# Patient Record
Sex: Male | Born: 1945 | Race: White | Hispanic: No | Smoking: Former smoker
Health system: Southern US, Community
[De-identification: ages and names within clinical notes are randomized; demographics above are authoritative.]

## PROBLEM LIST (undated history)

## (undated) DIAGNOSIS — R0609 Other forms of dyspnea: Secondary | ICD-10-CM

## (undated) DIAGNOSIS — I2510 Atherosclerotic heart disease of native coronary artery without angina pectoris: Secondary | ICD-10-CM

## (undated) DIAGNOSIS — C61 Malignant neoplasm of prostate: Secondary | ICD-10-CM

## (undated) DIAGNOSIS — Z8546 Personal history of malignant neoplasm of prostate: Secondary | ICD-10-CM

## (undated) DIAGNOSIS — I219 Acute myocardial infarction, unspecified: Secondary | ICD-10-CM

## (undated) DIAGNOSIS — Z7901 Long term (current) use of anticoagulants: Secondary | ICD-10-CM

## (undated) DIAGNOSIS — J439 Emphysema, unspecified: Secondary | ICD-10-CM

## (undated) DIAGNOSIS — I252 Old myocardial infarction: Secondary | ICD-10-CM

## (undated) DIAGNOSIS — E785 Hyperlipidemia, unspecified: Secondary | ICD-10-CM

## (undated) DIAGNOSIS — R310 Gross hematuria: Secondary | ICD-10-CM

## (undated) DIAGNOSIS — Z87442 Personal history of urinary calculi: Secondary | ICD-10-CM

## (undated) DIAGNOSIS — F1010 Alcohol abuse, uncomplicated: Secondary | ICD-10-CM

## (undated) DIAGNOSIS — R351 Nocturia: Secondary | ICD-10-CM

## (undated) DIAGNOSIS — Z955 Presence of coronary angioplasty implant and graft: Secondary | ICD-10-CM

## (undated) DIAGNOSIS — R0600 Dyspnea, unspecified: Secondary | ICD-10-CM

## (undated) DIAGNOSIS — N35919 Unspecified urethral stricture, male, unspecified site: Secondary | ICD-10-CM

## (undated) DIAGNOSIS — I10 Essential (primary) hypertension: Secondary | ICD-10-CM

## (undated) HISTORY — DX: Essential (primary) hypertension: I10

## (undated) HISTORY — DX: Personal history of malignant neoplasm of prostate: Z85.46

## (undated) HISTORY — PX: CORONARY ANGIOPLASTY WITH STENT PLACEMENT: SHX49

## (undated) HISTORY — PX: PROSTATE BIOPSY: SHX241

## (undated) HISTORY — DX: Atherosclerotic heart disease of native coronary artery without angina pectoris: I25.10

## (undated) HISTORY — PX: COLONOSCOPY WITH PROPOFOL: SHX5780

---
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
## 1978-11-08 HISTORY — PX: OTHER SURGICAL HISTORY: SHX169

## 2009-02-18 ENCOUNTER — Ambulatory Visit: Admission: RE | Admit: 2009-02-18 | Discharge: 2009-05-19 | Payer: Self-pay | Admitting: Radiation Oncology

## 2009-03-18 ENCOUNTER — Encounter: Admission: RE | Admit: 2009-03-18 | Discharge: 2009-03-18 | Payer: Self-pay | Admitting: Urology

## 2009-03-31 ENCOUNTER — Ambulatory Visit (HOSPITAL_BASED_OUTPATIENT_CLINIC_OR_DEPARTMENT_OTHER): Admission: RE | Admit: 2009-03-31 | Discharge: 2009-03-31 | Payer: Self-pay | Admitting: Urology

## 2009-03-31 HISTORY — PX: OTHER SURGICAL HISTORY: SHX169

## 2009-05-20 ENCOUNTER — Ambulatory Visit: Admission: RE | Admit: 2009-05-20 | Discharge: 2009-06-11 | Payer: Self-pay | Admitting: Radiation Oncology

## 2010-05-08 DIAGNOSIS — I252 Old myocardial infarction: Secondary | ICD-10-CM

## 2010-05-08 DIAGNOSIS — Z955 Presence of coronary angioplasty implant and graft: Secondary | ICD-10-CM

## 2010-05-08 HISTORY — DX: Old myocardial infarction: I25.2

## 2010-05-08 HISTORY — DX: Presence of coronary angioplasty implant and graft: Z95.5

## 2010-05-14 ENCOUNTER — Encounter: Payer: Self-pay | Admitting: Emergency Medicine

## 2010-05-14 ENCOUNTER — Observation Stay (HOSPITAL_COMMUNITY): Admission: EM | Admit: 2010-05-14 | Discharge: 2010-05-16 | Payer: Self-pay | Admitting: Cardiovascular Disease

## 2010-05-14 ENCOUNTER — Ambulatory Visit: Payer: Self-pay | Admitting: Internal Medicine

## 2010-05-15 ENCOUNTER — Encounter: Payer: Self-pay | Admitting: Internal Medicine

## 2010-05-29 DIAGNOSIS — F17200 Nicotine dependence, unspecified, uncomplicated: Secondary | ICD-10-CM

## 2010-05-29 DIAGNOSIS — I10 Essential (primary) hypertension: Secondary | ICD-10-CM | POA: Insufficient documentation

## 2010-05-29 DIAGNOSIS — F1010 Alcohol abuse, uncomplicated: Secondary | ICD-10-CM

## 2010-05-29 DIAGNOSIS — C61 Malignant neoplasm of prostate: Secondary | ICD-10-CM

## 2010-06-09 ENCOUNTER — Ambulatory Visit: Payer: Self-pay | Admitting: Cardiology

## 2010-06-09 ENCOUNTER — Encounter: Payer: Self-pay | Admitting: Physician Assistant

## 2010-06-09 DIAGNOSIS — I2510 Atherosclerotic heart disease of native coronary artery without angina pectoris: Secondary | ICD-10-CM | POA: Insufficient documentation

## 2010-06-26 ENCOUNTER — Telehealth: Payer: Self-pay | Admitting: Internal Medicine

## 2010-07-01 ENCOUNTER — Encounter: Payer: Self-pay | Admitting: Internal Medicine

## 2010-07-21 ENCOUNTER — Telehealth (INDEPENDENT_AMBULATORY_CARE_PROVIDER_SITE_OTHER): Payer: Self-pay | Admitting: *Deleted

## 2010-07-21 ENCOUNTER — Encounter: Payer: Self-pay | Admitting: Cardiology

## 2010-08-13 ENCOUNTER — Ambulatory Visit: Payer: Self-pay | Admitting: Cardiology

## 2010-08-13 DIAGNOSIS — E785 Hyperlipidemia, unspecified: Secondary | ICD-10-CM | POA: Insufficient documentation

## 2010-09-07 ENCOUNTER — Telehealth: Payer: Self-pay | Admitting: Cardiology

## 2010-09-11 ENCOUNTER — Telehealth: Payer: Self-pay | Admitting: Cardiology

## 2010-11-06 ENCOUNTER — Telehealth (INDEPENDENT_AMBULATORY_CARE_PROVIDER_SITE_OTHER): Payer: Self-pay | Admitting: *Deleted

## 2010-11-11 ENCOUNTER — Ambulatory Visit
Admission: RE | Admit: 2010-11-11 | Discharge: 2010-11-11 | Payer: Self-pay | Source: Home / Self Care | Attending: Internal Medicine | Admitting: Internal Medicine

## 2010-11-11 ENCOUNTER — Other Ambulatory Visit: Payer: Self-pay | Admitting: Internal Medicine

## 2010-11-12 LAB — CBC WITH DIFFERENTIAL/PLATELET
Basophils Absolute: 0 10*3/uL (ref 0.0–0.1)
Basophils Relative: 0.5 % (ref 0.0–3.0)
Eosinophils Absolute: 0.2 10*3/uL (ref 0.0–0.7)
Eosinophils Relative: 2 % (ref 0.0–5.0)
HCT: 38.5 % — ABNORMAL LOW (ref 39.0–52.0)
Hemoglobin: 13.2 g/dL (ref 13.0–17.0)
Lymphocytes Relative: 21.5 % (ref 12.0–46.0)
Lymphs Abs: 1.7 10*3/uL (ref 0.7–4.0)
MCHC: 34.3 g/dL (ref 30.0–36.0)
MCV: 99.7 fl (ref 78.0–100.0)
Monocytes Absolute: 0.8 10*3/uL (ref 0.1–1.0)
Monocytes Relative: 10.1 % (ref 3.0–12.0)
Neutro Abs: 5.3 10*3/uL (ref 1.4–7.7)
Neutrophils Relative %: 65.9 % (ref 43.0–77.0)
Platelets: 309 10*3/uL (ref 150.0–400.0)
RBC: 3.86 Mil/uL — ABNORMAL LOW (ref 4.22–5.81)
RDW: 13.4 % (ref 11.5–14.6)
WBC: 8 10*3/uL (ref 4.5–10.5)

## 2010-11-13 ENCOUNTER — Ambulatory Visit
Admission: RE | Admit: 2010-11-13 | Discharge: 2010-11-13 | Payer: Self-pay | Source: Home / Self Care | Attending: Internal Medicine | Admitting: Internal Medicine

## 2010-11-13 ENCOUNTER — Encounter: Payer: Self-pay | Admitting: Internal Medicine

## 2010-11-13 DIAGNOSIS — R21 Rash and other nonspecific skin eruption: Secondary | ICD-10-CM | POA: Insufficient documentation

## 2010-12-08 NOTE — Progress Notes (Signed)
Summary: pt returning call  Phone Note Call from Patient Call back at [facility] Phone [phone]   Caller: Patient Summary of Call: Pt returning call Initial call taken by: Judie Grieve,  September 11, 2010 1:12 PM  Follow-up for Phone Call        I spoke with the pt. He is agreeable with stopping effient and starting plavix. He will hold plavix tomorrow (as well as effient) and start plavix on sunday. He verbalizes understanding. He will call back should he still notice increased bleeding from the nose and gums.  Follow-up by: Heather McGhee, RN, BSN,  September 11, 2010 2:12 PM    New/Updated Medications: PLAVIX 75 MG TABS (CLOPIDOGREL BISULFATE) Take one tablet by mouth daily Prescriptions: PLAVIX 75 MG TABS (CLOPIDOGREL BISULFATE) Take one tablet by mouth daily  #30 x 11   Entered by:   Heather McGhee, RN, BSN   Authorized by:   Haylynn Pha Rogers Makylah Bossard, MD, FACC   Signed by:   Heather McGhee, RN, BSN on 09/11/2010   Method used:   [facility] to        [facility]  [address] [address] [id number]       [address]       [place], [place]  [address]       Ph: [phone] or [phone]       Fax: [phone]   RxID:   [id number]

## 2010-12-08 NOTE — Letter (Signed)
Summary: [facility]  [facility]   Imported By: Marylou Mccoy 06/23/2010 16:46:32  _____________________________________________________________________  External Attachment:    Type:   Image     Comment:   External Document

## 2010-12-08 NOTE — Progress Notes (Signed)
Summary: sided effect from meds- nose bleed  ask DB  Phone Note Call from Patient Call back at Home Phone [phone]   Caller: Patient Reason for Call: Talk to Nurse Summary of Call: side effect from meds - nose bleed.  Initial call taken by: Lorne Skeens,  June 26, 2010 3:24 PM  Follow-up for Phone Call        noticed yesterday after sneezing - blood on shirt.  it has occurred today as well.  He is on Effient and was started on 325 mg ASA 06/09/2010 when he saw [place].  Will review with Dr Gala Romney Follow-up by: Charolotte Capuchin, RN,  June 26, 2010 3:33 PM  Additional Follow-up for Phone Call Additional follow up Details #1::        PER DR Shalona Harbour HAVE PT DECREASE ASA TO 81 MG AND TO RETURN CALL WITH UPDATE.PT VERBALZIED UNDERSTANDING. Additional Follow-up by: Scherrie Bateman, LPN,  June 26, 2010 3:57 PM

## 2010-12-08 NOTE — Progress Notes (Signed)
Summary: nose -gums bleeding  Phone Note Call from Patient   Caller: Patient  [phone] Reason for Call: Talk to Nurse Summary of Call: gums and nose were bleeding this weekend-pt been on effient since july-pls advise Initial call taken by: Glynda Jaeger,  September 07, 2010 10:20 AM  Follow-up for Phone Call        this past Friday he was working in the cold,  his nose started bleeding and kept up for several hours.  Is taking Effient 10mg  and ASA 81 mg.  Bleeding stopped after getting out of cold.  Today afterblowing nose, some blood there.  Occasionally gums bleed. has not seen any blood in stool or urine that he has noticed.  Pt also taking Flomax 0.4 mg tablet daily.  [facility] if there are to be any medication changes.  Pt aware Dr Juanda Chance is not in the office today and since he has no active bleeding now, we will review with him and call pt back with any changes necessary.  Pt aware to call back if the bleeding re-occurs of starts from a new area. Follow-up by: Charolotte Capuchin, RN,  September 07, 2010 12:51 PM     Appended Document: nose -gums bleeding Reviewed with Dr. Juanda Chance. Orders received to have the pt. d/c effient and start plavix. The pt should d/c effient, then take nothing the day after, then on following day he should start plavix. I have left a message for the pt. to call.

## 2010-12-08 NOTE — Letter (Signed)
[facility]   Imported By: Marylou Mccoy 07/23/2010 15:09:00  _____________________________________________________________________  External Attachment:    Type:   Image     Comment:   External Document

## 2010-12-08 NOTE — Progress Notes (Signed)
  Phone Note Other Incoming   Request: Send information Summary of Call: Request for records received from [facility]. Faxed 06/09/2010 ov and EKG to [phone].

## 2010-12-08 NOTE — Assessment & Plan Note (Signed)
Summary: eph/ gd   Visit Type:  post-hospital  CC:  No complains.  History of Present Illness: This is a 65 year old white male patient who was admitted to the hospital with a non-ST elevation MI treated with drug-eluting stent to the first obtuse marginal. He had normal LV function ejection fraction 65% and nonobstructive disease elsewhere. The patient does have a history of hypertension hyperlipidemia and tobacco abuse. He claims he quit smoking the day he will was admitted to the hospital.  Patient denies chest pain palpitations dyspnea dyspnea on exertion dizziness or presyncope. The patient has inadvertently forgotten to take aspirin.  Current Medications (verified): 1)  Lisinopril 10 Mg Tabs (Lisinopril) .... Take One Tablet By Mouth Daily 2)  Effient 10 Mg Tabs (Prasugrel Hcl) .... Take One Tablet By Mouth Daily 3)  Metoprolol Succinate 25 Mg Xr24h-Tab (Metoprolol Succinate) .... Take One Tablet By Mouth Daily 4)  Simvastatin 20 Mg Tabs (Simvastatin) .... Take One Tablet By Mouth Daily At Bedtime  Allergies (verified): 1)  ! Hydrocodone  Past History:  Past Medical History: Last updated: 05/29/2010  1. Hypertension.   2. Ongoing tobacco use, 2 packs per day.   3. Alcohol use.  Reports 2 to 3 beers a day.   4. History of prostate cancer status post XRT and seed implantation in       May 2010.   Review of Systems       see history of present illness  Vital Signs:  Patient profile:   65 year old male Height:      68 inches Weight:      185.50 pounds BMI:     28.31 Pulse rate:   61 / minute Pulse rhythm:   regular Resp:     18 per minute BP sitting:   100 / 58  (left arm) Cuff size:   large  Vitals Entered By: Vikki Ports (June 09, 2010 10:32 AM)  Physical Exam  General:   Well-nournished, in no acute distress.smells of cigarette smoke. Neck: No JVD, HJR, Bruit, or thyroid enlargement Lungs: No tachypnea, clear without wheezing, rales, or  rhonchi Cardiovascular: RRR, PMI not displaced, heart sounds normal, no murmurs, gallops, bruit, thrill, or heave. Abdomen: BS normal. Soft without organomegaly, masses, lesions or tenderness. Extremities: right radial artery without hematoma or hemorrhage she has good brachial pulse, lower extremities without cyanosis, clubbing or edema. Good distal pulses bilateral SKin: Warm, no lesions or rashes  Musculoskeletal: No deformities Neuro: no focal signs    EKG  Procedure date:  06/09/2010  Findings:      normal sinus rhythm normal EKG  Impression & Recommendations:  Problem # 1:  CAD, NATIVE VESSEL (ICD-414.01) Patient had non-ST elevation MI treated with stenting to the OM with a drug-eluting stent May 14, 2010. His normal LV function. He is doing well without further chest pain.resume coated aspirin 325 mg once a day His updated medication list for this problem includes:    Lisinopril 10 Mg Tabs (Lisinopril) .Marland Kitchen... Take one tablet by mouth daily    Effient 10 Mg Tabs (Prasugrel hcl) .Marland Kitchen... Take one tablet by mouth daily    Metoprolol Succinate 25 Mg Xr24h-tab (Metoprolol succinate) .Marland Kitchen... Take one tablet by mouth daily    Aspirin 325 Mg Tabs (Aspirin) .Marland Kitchen... Take one tablet by mouth daily  Problem # 2:  TOBACCO USER (ICD-305.1) Patient claims he quit smoking but he does smell of cigarette smoke. He is in a house where others smoke. I did  counsel him on the importance of smoking cessation.  Problem # 3:  HYPERTENSION (ICD-401.9) blood pressure stable His updated medication list for this problem includes:    Lisinopril 10 Mg Tabs (Lisinopril) .Marland Kitchen... Take one tablet by mouth daily    Metoprolol Succinate 25 Mg Xr24h-tab (Metoprolol succinate) .Marland Kitchen... Take one tablet by mouth daily    Aspirin 325 Mg Tabs (Aspirin) .Marland Kitchen... Take one tablet by mouth daily  Patient Instructions: 1)  Your physician recommends that you schedule a follow-up appointment in: 2 months with Dr. Gala Romney 2)  Your  physician has recommended you make the following change in your medication: Add Asprin 325 mg take one tablet by mouth once daily

## 2010-12-08 NOTE — Miscellaneous (Signed)
  Clinical Lists Changes  Medications: Changed medication from ASPIRIN 325 MG TABS (ASPIRIN) take one tablet by mouth daily to ASPIRIN 81 MG TBEC (ASPIRIN) Take one tablet by mouth daily Changed medication from SIMVASTATIN 20 MG TABS (SIMVASTATIN) Take one tablet by mouth daily at bedtime to SIMVASTATIN 40 MG TABS (SIMVASTATIN) 1 tab once daily Observations: Added new observation of MEDRECON: current updated (08/13/2010 9:09) Added new observation of NKA: F (08/13/2010 9:09) Added new observation of ALLERGY REV: Done (08/13/2010 9:09)      Current Medications (verified): 1)  Lisinopril 10 Mg Tabs (Lisinopril) .... Take One Tablet By Mouth Daily 2)  Effient 10 Mg Tabs (Prasugrel Hcl) .... Take One Tablet By Mouth Daily 3)  Metoprolol Succinate 25 Mg Xr24h-Tab (Metoprolol Succinate) .... Take One Tablet By Mouth Daily 4)  Simvastatin 40 Mg Tabs (Simvastatin) .Marland Kitchen.. 1 Tab Once Daily 5)  Aspirin 81 Mg Tbec (Aspirin) .... Take One Tablet By Mouth Daily  Allergies (verified): 1)  ! Hydrocodone

## 2010-12-08 NOTE — Assessment & Plan Note (Signed)
Summary: per check out   Visit Type:  Follow-up Primary Provider:  Dr [facility].  CC:  pt stop smoking- no cardiac complaints.  History of Present Illness: [name] is 65 years old and return for management of CAD. In July of 2011 he had a non-ST elevation MI and was treated with a DES to the marginal branch the circumflex artery. His ejection fraction is 65%. He has done well since that time has had no recent chest pain shortness of breath or palpitations. He was on 325 mg of aspirin and this was decreased at 81 mg when he developed nose bleeds.  He was able to stop smoking after his heart attack. He has gained some weight since that time.  His other problems include hypertension and hyperlipidemia. He also has cancer of the prostate which was treated with seed therapy in 2010.  Current Medications (verified): 1)  Lisinopril 10 Mg Tabs (Lisinopril) .... Take One Tablet By Mouth Daily 2)  Effient 10 Mg Tabs (Prasugrel Hcl) .... Take One Tablet By Mouth Daily 3)  Metoprolol Succinate 25 Mg Xr24h-Tab (Metoprolol Succinate) .... Take One Tablet By Mouth Daily 4)  Simvastatin 40 Mg Tabs (Simvastatin) .Marland Kitchen.. 1 Tab Once Daily 5)  Aspirin 81 Mg Tbec (Aspirin) .... Take One Tablet By Mouth Daily  Allergies (verified): 1)  ! Hydrocodone  Past History:  Past Medical History: Last updated: 05/29/2010  1. Hypertension.   2. Ongoing tobacco use, 2 packs per day.   3. Alcohol use.  Reports 2 to 3 beers a day.   4. History of prostate cancer status post XRT and seed implantation in       May 2010.   Review of Systems       ROS is negative except as outlined in HPI.   Vital Signs:  Patient profile:   65 year old male Height:      68 inches Weight:      202 pounds BMI:     30.83 Pulse rate:   53 / minute BP sitting:   127 / 77  (left arm) Cuff size:   large  Vitals Entered By: Burnett Kanaris, CNA (August 13, 2010 9:10 AM)  Physical Exam  Additional Exam:  Gen.  Well-nourished, in no distress   Neck: No JVD, thyroid not enlarged, no carotid bruits Lungs: No tachypnea, clear without rales, rhonchi or wheezes Cardiovascular: Rhythm regular, PMI not displaced,  heart sounds  normal, no murmurs or gallops, no peripheral edema, pulses normal in all 4 extremities. Abdomen: BS normal, abdomen soft and non-tender without masses or organomegaly, no hepatosplenomegaly. MS: No deformities, no cyanosis or clubbing   Neuro:  No focal sns   Skin:  no lesions    Impression & Recommendations:  Problem # 1:  CAD, NATIVE VESSEL (ICD-414.01) He had a non-ST elevation MI and was treated with a DES to the circumflex artery. He's had no chest pain this prompted stable. His updated medication list for this problem includes:    Lisinopril 10 Mg Tabs (Lisinopril) .Marland Kitchen... Take one tablet by mouth daily    Effient 10 Mg Tabs (Prasugrel hcl) .Marland Kitchen... Take one tablet by mouth daily    Metoprolol Succinate 25 Mg Xr24h-tab (Metoprolol succinate) .Marland Kitchen... Take one tablet by mouth daily    Aspirin 81 Mg Tbec (Aspirin) .Marland Kitchen... Take one tablet by mouth daily  His updated medication list for this problem includes:    Lisinopril 10 Mg Tabs (Lisinopril) .Marland Kitchen... Take one  tablet by mouth daily    Effient 10 Mg Tabs (Prasugrel hcl) .Marland Kitchen... Take one tablet by mouth daily    Metoprolol Succinate 25 Mg Xr24h-tab (Metoprolol succinate) .Marland Kitchen... Take one tablet by mouth daily    Aspirin 81 Mg Tbec (Aspirin) .Marland Kitchen... Take one tablet by mouth daily  Orders: EKG w/ Interpretation (93000)  Problem # 2:  HYPERTENSION (ICD-401.9) His blood pressure is well controlled on current medications. His updated medication list for this problem includes:    Lisinopril 10 Mg Tabs (Lisinopril) .Marland Kitchen... Take one tablet by mouth daily    Metoprolol Succinate 25 Mg Xr24h-tab (Metoprolol succinate) .Marland Kitchen... Take one tablet by mouth daily    Aspirin 81 Mg Tbec (Aspirin) .Marland Kitchen... Take one tablet by mouth daily  Problem # 3:  TOBACCO  USER (ICD-305.1) He has been able to stop smoking since his heart attack.  Problem # 4:  HYPERLIPIDEMIA-MIXED (ICD-272.4) This is being managed by his primary care physician. His simvastatin was recently increased. His updated medication list for this problem includes:    Simvastatin 40 Mg Tabs (Simvastatin) .Marland Kitchen... 1 tab once daily  Patient Instructions: 1)  Your physician wants you to follow-up in:  July 2012 with Dr. Gala Romney. You will receive a reminder letter in the mail two months in advance. If you don't receive a letter, please call our office to schedule the follow-up appointment. 2)  Your physician recommends that you continue on your current medications as directed. Please refer to the Current Medication list given to you today.

## 2010-12-10 NOTE — Assessment & Plan Note (Signed)
Summary: rov. gd   Visit Type:  Follow-up Primary Provider:  Dr Larina [facility]  CC:  possible med allergy (plavix).  History of Present Illness: Aaron King is 65 year-old male with h/o HTN, HL, tobacco use and CAD.  In July of 2011 he had a non-ST elevation MI and was treated with a DES to the marginal branch the circumflex artery. His ejection fraction is 65%. Initially on Effient but switched to Plavix due to gum bleeding and epistaxis. Presents today for unscheduled f/u due to bruising and rash.  Called the office recently about pruritic rash on arms and legs. Thought it might be due to Plavix. Started on Benadryl and scheduled to f/u with me today.   Benadryl has helped with itching but has persistent rash. Occasional bruising.  No CP or SOB. BP well controlled.   He was able to stop smoking after his heart attack and has not started back.  Current Medications (verified): 1)  Lisinopril 10 Mg Tabs (Lisinopril) .... Take One Tablet By Mouth Daily 2)  Plavix 75 Mg Tabs (Clopidogrel Bisulfate) .... Take One Tablet By Mouth Daily 3)  Metoprolol Succinate 25 Mg Xr24h-Tab (Metoprolol Succinate) .... Take One Tablet By Mouth Daily 4)  Simvastatin 40 Mg Tabs (Simvastatin) .Marland Kitchen.. 1 Tab Once Daily 5)  Aspirin 81 Mg Tbec (Aspirin) .... Take One Tablet By Mouth Daily 6)  Nitrostat 0.4 Mg Subl (Nitroglycerin) .Marland Kitchen.. 1 Tablet Under Tongue At Onset of Chest Pain; You May Repeat Every 5 Minutes For Up To 3 Doses. 7)  Benadryl 25 Mg Tabs (Diphenhydramine Hcl) 8)  Tamsulosin Hcl 0.4 Mg Caps (Tamsulosin Hcl) .... Take 1 Tablet By Mouth Once A Day 9)  Vitamin B-12 500 Mcg  Tabs (Cyanocobalamin) .... Take 1 Tablet By Mouth Once A Day  Allergies (verified): 1)  ! Hydrocodone  Past History:  Past Medical History:  1. Hypertension.   2. CAD s/p MI July 2011 with DES       --Effient stopped due to gum bleeding and epistaxis  3. Ongoing tobacco use, 2 packs per day.   4. History of alcohol use.   Reports 2 to 3 beers a day.   5. History of prostate cancer status post XRT and seed implantation in       May 2010.   Review of Systems       As per HPI and past medical history; otherwise all systems negative.   Vital Signs:  Patient profile:   64 year old male Height:      68 inches Weight:      200 pounds BMI:     30.52 Pulse rate:   57 / minute BP sitting:   128 / 84  (left arm) Cuff size:   regular  Vitals Entered By: Hardin Negus, RMA (November 13, 2010 9:29 AM)  Physical Exam  General:  Gen: well appearing. no resp difficulty HEENT: normal except for actinic keratoses. Neck: supple. no JVD. Carotids 2+ bilat; no bruits. No lymphadenopathy or thryomegaly appreciated. Cor: PMI nondisplaced. Regular rate & rhythm. No rubs, gallops, murmur. Lungs: clear Abdomen: soft, nontender, nondistended. No hepatosplenomegaly. No bruits or masses. Good bowel sounds. Extremities: no cyanosis, clubbing, rash, edema Neuro: alert & orientedx3, cranial nerves grossly intact. moves all 4 extremities w/o difficulty. affect pleasant Skin: multiple scaly patches on arms and legs mild associated brusing   Impression & Recommendations:  Problem # 1:  RASH AND OTHER NONSPECIFIC SKIN ERUPTION (ICD-782.1) Appears to be eczema  not a drug rash. Will try 1% hydrocortisone cream and refer to dermatology to follow-up. Continue benadryl prn for symptomatic relief.  Problem # 2:  CAD, NATIVE VESSEL (ICD-414.01) Stable. No evidence of ischemia. Continue current regimen. Needs to stay on Plavix for at least another 6 months (1 year out from DES).   Other Orders: Misc. Referral (Misc. Ref)  Patient Instructions: 1)  We have sent in a prescription for Hydrocortizone Cream 2)  You have been referred to [facility] 3)  Your physician wants you to follow-up in:  6 months.  You will receive a reminder letter in the mail two months in advance. If you don't receive a letter, please call our  office to schedule the follow-up appointment. Prescriptions: HYDROCORTISONE 1 % CREA (HYDROCORTISONE) apply 2-3 times daily as needed  #2 tubes x 3   Entered by:   Meredith Staggers, RN   Authorized by:   Dolores Patty, MD, [facility]   Signed by:   Meredith Staggers, RN on 11/13/2010   Method used:   [facility]  [address] [phone]       [address]       [place], [place]  [address]       Ph: [phone] or [phone]       Fax: [phone]   RxID:   [id number]

## 2010-12-10 NOTE — Progress Notes (Signed)
Summary: Rash and itching  Phone Note Outgoing Call   Call placed by: rhonda Call placed to: Patient Details for Reason: pt stopped by to report reaction to plavix at 11:26 today Summary of Call: pt states that he has a rash on arms and legs and it itches a lot and that both his arms looks as if he has been in a fight a lost. Pt also c/o dizziness-last night when he bent over to tie his shoes and a few hours ago when he was leaning over. He does not know what MD he is assigned to since Dr. Juanda Chance retired. adv would get back to him.  Initial call taken by: Claris Gladden RN,  November 06, 2010 1:43 PM  Follow-up for Phone Call        11/05/10--1515pm--pt states having reaction to plavix--rash over body--some bruising--advised to d/c plavix and con't with asa 81mg  once daily, and i will find out who dr Juanda Chance wants him to f/u with and make him appoint with that doctor 11/05/10--1545PM--Called pt back and left mess. that his new doctor is dr bensimhon and i have made him an appoint with dr bensimhon on jan 6 at 0915--nt Follow-up by: Ledon Snare, RN,  November 06, 2010 3:19 PM  Additional Follow-up for Phone Call Additional follow up Details #1::        SPoke with pt who is aware that stopping the Plavix is not an option without him being on antiplatelet therapy due to his increased risk of clost formation in his stent placed in 05/2010.  Pt states understanding and that he will stay on Plavix and use OTC benadryl until his appt 11/13/2010 with Dr Gala Romney. Additional Follow-up by: Charolotte Capuchin, RN,  November 06, 2010 4:52 PM     Appended Document: Rash and itching is rash getting better? he also needs a CBC to check his counts. can we do this today or tomorrow prior to his appt?   Appended Document: Rash and itching 11/11/10--1045am--phoned pt to see how he was doing but no answer and no answering machine--nt  Appended Document: Rash and itching 11/11/10--1330pm--spoke to mr Chim  who states rash and itching are about the same--he continues with benadryl and plavix--he will come in today 1/4 for CBC --he will see dr bensimhon on 1/6

## 2011-01-24 LAB — CARDIAC PANEL(CRET KIN+CKTOT+MB+TROPI)
CK, MB: 45.1 ng/mL (ref 0.3–4.0)
CK, MB: 65.8 ng/mL (ref 0.3–4.0)
Total CK: 366 U/L — ABNORMAL HIGH (ref 7–232)
Total CK: 507 U/L — ABNORMAL HIGH (ref 7–232)

## 2011-01-24 LAB — URINALYSIS, ROUTINE W REFLEX MICROSCOPIC
Bilirubin Urine: NEGATIVE
Hgb urine dipstick: NEGATIVE
Nitrite: NEGATIVE
Protein, ur: NEGATIVE mg/dL
Urobilinogen, UA: 0.2 mg/dL (ref 0.0–1.0)

## 2011-01-24 LAB — COMPREHENSIVE METABOLIC PANEL
Albumin: 4.1 g/dL (ref 3.5–5.2)
Alkaline Phosphatase: 70 U/L (ref 39–117)
BUN: 7 mg/dL (ref 6–23)
CO2: 25 mEq/L (ref 19–32)
Chloride: 102 mEq/L (ref 96–112)
GFR calc non Af Amer: >60 mL/min (ref >60)
Glucose, Bld: 104 mg/dL — ABNORMAL HIGH (ref 70–99)
Potassium: 5.1 mEq/L (ref 3.5–5.1)
Total Bilirubin: 0.9 mg/dL (ref 0.3–1.2)

## 2011-01-24 LAB — DIFFERENTIAL
Basophils Absolute: 0.1 10*3/uL (ref 0.0–0.1)
Basophils Relative: 1 % (ref 0–1)
Monocytes Absolute: 0.9 10*3/uL (ref 0.1–1.0)
Neutro Abs: 6.1 10*3/uL (ref 1.7–7.7)

## 2011-01-24 LAB — CBC
HCT: 44.3 % (ref 39.0–52.0)
HCT: 39.3 % (ref 39.0–52.0)
Hemoglobin: 13.8 g/dL (ref 13.0–17.0)
MCH: 36 pg — ABNORMAL HIGH (ref 26.0–34.0)
MCH: 35.6 pg — ABNORMAL HIGH (ref 26.0–34.0)
MCH: 36.1 pg — ABNORMAL HIGH (ref 26.0–34.0)
MCHC: 34.9 g/dL (ref 30.0–36.0)
MCHC: 34.3 g/dL (ref 30.0–36.0)
MCHC: 35 g/dL (ref 30.0–36.0)
MCV: 103.4 fL — ABNORMAL HIGH (ref 78.0–100.0)
MCV: 103.9 fL — ABNORMAL HIGH (ref 78.0–100.0)
Platelets: 313 10*3/uL (ref 150–400)
Platelets: 243 10*3/uL (ref 150–400)
RDW: 13.3 % (ref 11.5–15.5)
RDW: 13.6 % (ref 11.5–15.5)

## 2011-01-24 LAB — BASIC METABOLIC PANEL
BUN: 8 mg/dL (ref 6–23)
CO2: 27 mEq/L (ref 19–32)
Chloride: 100 mEq/L (ref 96–112)
Creatinine, Ser: 0.93 mg/dL (ref 0.4–1.5)

## 2011-01-24 LAB — LIPID PANEL
Cholesterol: 166 mg/dL (ref 0–200)
LDL Cholesterol: 84 mg/dL (ref 0–99)
Total CHOL/HDL Ratio: 2.6 RATIO
Triglycerides: 92 mg/dL (ref <150)

## 2011-01-24 LAB — CK TOTAL AND CKMB (NOT AT ARMC): Relative Index: 9.7 — ABNORMAL HIGH (ref 0.0–2.5)

## 2011-01-24 LAB — PROTIME-INR: Prothrombin Time: 13.2 seconds (ref 11.6–15.2)

## 2011-01-24 LAB — TROPONIN I: Troponin I: 0.8 ng/mL (ref 0.00–0.06)

## 2011-02-15 ENCOUNTER — Telehealth: Payer: Self-pay | Admitting: Internal Medicine

## 2011-02-15 MED ORDER — CLOPIDOGREL BISULFATE 75 MG PO TABS: 75.0000 mg | ORAL_TABLET | Freq: Every day | ORAL | Status: DC

## 2011-02-16 LAB — CBC
HCT: 40.8 % (ref 39.0–52.0)
Platelets: 304 10*3/uL (ref 150–400)
RDW: 13.1 % (ref 11.5–15.5)

## 2011-02-16 LAB — COMPREHENSIVE METABOLIC PANEL
Albumin: 4 g/dL (ref 3.5–5.2)
Alkaline Phosphatase: 65 U/L (ref 39–117)
BUN: 6 mg/dL (ref 6–23)
Potassium: 5.6 mEq/L — ABNORMAL HIGH (ref 3.5–5.1)
Sodium: 141 mEq/L (ref 135–145)
Total Protein: 6.3 g/dL (ref 6.0–8.3)

## 2011-02-16 LAB — PROTIME-INR: INR: 1 (ref 0.00–1.49)

## 2011-03-23 NOTE — Op Note (Signed)
Aaron King, RAUDENBUSH NO.:  [id number]   MEDICAL RECORD NO.:  [id number]          PATIENT TYPE:  AMB   LOCATION:  [facility]                         FACILITY:  [facility]   PHYSICIAN:  Valetta Fuller, M.D.  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   POSTOPERATIVE DIAGNOSIS:  Favorable clinical stage T1 C adenocarcinoma  of the prostate.   PROCEDURE PERFORMED:  1. I 125 seed implantation of the prostate via Engineer, technical sales.  2. Flexible cystoscopy.   SURGEON:  Valetta Fuller, M.D.   ASSISTANT:  Maryln Gottron, M.D.   ANESTHESIA:  General.   INDICATIONS:  Mr. Mabe is 65 years of age.  He had a very strong  family history of prostate cancer.  PSA was actually normal at 3.2.  Ultrasound revealed a small prostate of 24 grams.  The patient had one  out of 12 biopsies positive for adenocarcinoma prostate.  This involved  20% of one core and was Gleason 3+3=6.  We discussed at length treatment  options and he elected to proceed with a monotherapy via brachytherapy  which we felt was a very reasonable decision.  The patient was seen in  consultation with Dr. Ballard Russell.  The patient also had prostate cancer  pathology sent to the prostate lab [facility]. His pathology was felt to be  favorable.  The patient appeared to understand the advantages and  disadvantages of seed implantation and full informed consent was  obtained.   TECHNIQUE AND FINDINGS:  The patient received perioperative  ciprofloxacin.  He had successful induction of general anesthesia and  was placed in the mid lithotomy position.  Appropriate surgical time-out  was performed.  The patient had transrectal ultrasound probe placed  which was anchored to a floor stand.  Anchoring needles were placed in  the  prostate.  Foley catheter with contrast in the balloon was then  inserted without difficulty.  Radiation oncology then spent time  contouring the urethra, prostate and rectum.  Dosing parameters were  then established and a real time plan was established.  The reference  dose was 145 Gy.  Each needle was passed with real time sagittal  ultrasound guidance.  Total of 30 active needles were placed.  Each seed  implantation was then done by the nuclear drawn robotic implanter.  A  total number of activated seeds was 80.  At the completion of the  procedure, the distribution fluoroscopically looked excellent.  Foley  catheter was removed and flexible cystoscopy revealed no  evidence of seeds within the prostatic urethra or bladder.  Lidocaine  jelly was instilled and a new Foley catheter was placed which drained  completely clear urine.  The patient appeared to tolerate the procedure  well.  There did not appear to be any obvious complications or problems  and he was brought to recovery room in stable condition.  Valetta Fuller, M.D.  Electronically Signed     DSG/MEDQ  D:  03/31/2009  T:  03/31/2009  Job:  [id number]   cc:   Maryln Gottron, M.D.  Fax: [phone]   Burnell Blanks, MD  Fax: [phone]

## 2011-06-03 ENCOUNTER — Encounter: Payer: Self-pay | Admitting: Internal Medicine

## 2011-06-07 ENCOUNTER — Ambulatory Visit (INDEPENDENT_AMBULATORY_CARE_PROVIDER_SITE_OTHER): Payer: BC Managed Care – PPO | Admitting: Internal Medicine

## 2011-06-07 ENCOUNTER — Encounter: Payer: Self-pay | Admitting: Internal Medicine

## 2011-06-07 ENCOUNTER — Ambulatory Visit: Payer: Self-pay | Admitting: Internal Medicine

## 2011-06-07 VITALS — BP 110/72 | HR 66 | Ht 68.0 in | Wt 209.0 lb

## 2011-06-07 DIAGNOSIS — I2510 Atherosclerotic heart disease of native coronary artery without angina pectoris: Secondary | ICD-10-CM

## 2011-06-07 DIAGNOSIS — I10 Essential (primary) hypertension: Secondary | ICD-10-CM

## 2011-06-07 DIAGNOSIS — E785 Hyperlipidemia, unspecified: Secondary | ICD-10-CM

## 2011-06-07 MED ORDER — ATORVASTATIN CALCIUM 40 MG PO TABS: 40.0000 mg | ORAL_TABLET | Freq: Every day | ORAL | Status: DC

## 2011-06-07 NOTE — Progress Notes (Signed)
HPI:  Aaron King is a very pleasant 65 year-old male with h/o HTN, HL, tobacco use and CAD.  In July of 2011 he had a non-ST elevation MI and was treated with a DES to the marginal branch the circumflex artery. His ejection fraction is 65%. Initially on Effient but switched to Plavix due to gum bleeding and epistaxis.  Doing quite well. No CP or SOB.  BP well controlled. Dr. Nathanial Rancher switched him from simvastatin to Vytorin 10-40 due to elevated LDL with simva 40. Having trouble affording Vytorin.   He was able to stop smoking after his heart attack and has not started back.   ROS: All systems negative except as listed in HPI, PMH and Problem List.  Past Medical History  Diagnosis Date  . Hypertension   . CAD (coronary artery disease)     s/p MI july 2011 with DES. Effient stopped due to gum bleeding and epistaxis  . Tobacco user     2 PPD  . Alcohol use     2-3 beers a day  . History of prostate cancer     s/p XRT and seed implantation in May 2010    Current Outpatient Prescriptions  Medication Sig Dispense Refill  . aspirin 81 MG EC tablet Take 81 mg by mouth daily.        . clopidogrel (PLAVIX) 75 MG tablet Take 1 tablet (75 mg total) by mouth daily.  90 tablet  3  . diphenhydrAMINE (BENADRYL) 25 MG tablet Take 25 mg by mouth every 6 (six) hours as needed.        . hydrocortisone 1 % cream Apply 1 application topically 3 (three) times daily as needed.        Marland Kitchen lisinopril (PRINIVIL,ZESTRIL) 10 MG tablet Take 10 mg by mouth daily.        . metoprolol succinate (TOPROL-XL) 25 MG 24 hr tablet Take 25 mg by mouth daily.        . nitroGLYCERIN (NITROSTAT) 0.4 MG SL tablet Place 0.4 mg under the tongue every 5 (five) minutes as needed.        . Tamsulosin HCl (FLOMAX) 0.4 MG CAPS Take 0.4 mg by mouth daily.        . vitamin B-12 (CYANOCOBALAMIN) 500 MCG tablet Take 500 mcg by mouth daily.           PHYSICAL EXAM: Filed Vitals:   06/07/11 1408  BP: 110/72  Pulse: 66   General:  Well  appearing. No resp difficulty HEENT: normal Neck: supple. JVP flat. Carotids 2+ bilaterally; no bruits. No lymphadenopathy or thryomegaly appreciated. Cor: PMI normal. Regular rate & rhythm. No rubs, gallops or murmurs. Lungs: clear Abdomen: soft, nontender, nondistended.Peri Jefferson bowel sounds. Extremities: no cyanosis, clubbing, rash, edema Neuro: alert & orientedx3, cranial nerves grossly intact. Moves all 4 extremities w/o difficulty. Affect pleasant.   ECG: NSR 66 mild ST scooping   ASSESSMENT & PLAN:

## 2011-06-07 NOTE — Assessment & Plan Note (Signed)
Having problems affording Vytorin. Will switch to atorvastatin 40 in attempt to get LDL < 70.

## 2011-06-07 NOTE — Assessment & Plan Note (Signed)
Blood pressure well controlled. Continue current regimen.  

## 2011-06-07 NOTE — Assessment & Plan Note (Signed)
No evidence of ischemia. Continue current regimen.   

## 2011-07-07 ENCOUNTER — Other Ambulatory Visit: Payer: Self-pay | Admitting: *Deleted

## 2011-07-07 MED ORDER — METOPROLOL SUCCINATE ER 25 MG PO TB24: 25.0000 mg | ORAL_TABLET | Freq: Every day | ORAL | Status: DC

## 2011-07-28 ENCOUNTER — Encounter: Payer: Self-pay | Admitting: Internal Medicine

## 2012-02-15 ENCOUNTER — Other Ambulatory Visit: Payer: Self-pay

## 2012-02-15 MED ORDER — CLOPIDOGREL BISULFATE 75 MG PO TABS: 75.0000 mg | ORAL_TABLET | Freq: Every day | ORAL | Status: DC

## 2012-06-05 ENCOUNTER — Encounter: Payer: Self-pay | Admitting: *Deleted

## 2012-06-07 ENCOUNTER — Ambulatory Visit (INDEPENDENT_AMBULATORY_CARE_PROVIDER_SITE_OTHER): Payer: BC Managed Care – PPO | Admitting: Cardiology

## 2012-06-07 ENCOUNTER — Encounter: Payer: Self-pay | Admitting: Cardiology

## 2012-06-07 VITALS — BP 114/66 | HR 58 | Ht 68.0 in | Wt 213.0 lb

## 2012-06-07 DIAGNOSIS — I10 Essential (primary) hypertension: Secondary | ICD-10-CM

## 2012-06-07 DIAGNOSIS — I2510 Atherosclerotic heart disease of native coronary artery without angina pectoris: Secondary | ICD-10-CM

## 2012-06-07 DIAGNOSIS — E785 Hyperlipidemia, unspecified: Secondary | ICD-10-CM

## 2012-06-07 LAB — BASIC METABOLIC PANEL
BUN: 10 mg/dL (ref 6–23)
CO2: 27 mEq/L (ref 19–32)
Calcium: 9.1 mg/dL (ref 8.4–10.5)
Creatinine, Ser: 0.9 mg/dL (ref 0.4–1.5)
Glucose, Bld: 87 mg/dL (ref 70–99)

## 2012-06-07 LAB — LIPID PANEL
HDL: 73.7 mg/dL (ref >39.00)
Total CHOL/HDL Ratio: 2
Triglycerides: 69 mg/dL (ref 0.0–149.0)

## 2012-06-07 NOTE — Progress Notes (Signed)
Patient ID: Aaron King, male   DOB: December 27, 1945, 66 y.o.   MRN: [id number] PCP: Dr. Nathanial Rancher  66 yo with history of CAD s/p NSTEMI in 7/11 presents for cardiology followup. He has been seen by Dr. Gala Romney in the past and I am seeing him for the first time today.  Patient had PCI with DES to OM at the time of his MI.  He has done well in general since then.  He is active and does his yardwork and housework.  No exertional dyspnea or chest pain. BP is controlled.    ECG: NSR, normal  PMH: 1. Prostate cancer: s/p XRT and seed implantation in 5/10.   2. HTN 3. CAD: NSTEMI 7/11 with DES to OM.   4. Obesity 5. Hyperlipidemia  SH: Quit smoking after MI in 2011.  Retired IT sales professional.  Lives alone in [place].   FH: Father with CHF, CAD.  ROS: All systems reviewed and negative except as per HPI.   Current Outpatient Prescriptions  Medication Sig Dispense Refill  . aspirin 81 MG EC tablet Take 81 mg by mouth daily.        Marland Kitchen atorvastatin (LIPITOR) 40 MG tablet Take 1 tablet (40 mg total) by mouth daily.  30 tablet  6  . clopidogrel (PLAVIX) 75 MG tablet Take 1 tablet (75 mg total) by mouth daily.  90 tablet  3  . lisinopril (PRINIVIL,ZESTRIL) 10 MG tablet Take 10 mg by mouth daily.        . metoprolol succinate (TOPROL-XL) 25 MG 24 hr tablet Take 1 tablet (25 mg total) by mouth daily.  30 tablet  11  . nitroGLYCERIN (NITROSTAT) 0.4 MG SL tablet Place 0.4 mg under the tongue every 5 (five) minutes as needed.        . Tamsulosin HCl (FLOMAX) 0.4 MG CAPS Take 0.4 mg by mouth daily.        . vitamin B-12 (CYANOCOBALAMIN) 1000 MCG tablet Take 1,000 mcg by mouth daily.        BP 114/66  Pulse 58  Ht 5\' 8"  (1.727 m)  Wt 213 lb (96.616 kg)  BMI 32.39 kg/m2 General: NAD, overweight. Neck: No JVD, no thyromegaly or thyroid nodule.  Lungs: Clear to auscultation bilaterally with normal respiratory effort. CV: Nondisplaced PMI.  Heart regular S1/S2, no S3/S4, no murmur.  No peripheral edema.  No  carotid bruit.  Normal pedal pulses.  Abdomen: Soft, nontender, no hepatosplenomegaly, no distention.  Neurologic: Alert and oriented x 3.  Psych: Normal affect. Extremities: No clubbing or cyanosis.

## 2012-06-07 NOTE — Patient Instructions (Addendum)
Your physician recommends that you have a FASTING lipid profile /BMET today.  Your physician wants you to follow-up in: 1 year with Dr McLean. (July 2014).You will receive a reminder letter in the mail two months in advance. If you don't receive a letter, please call our office to schedule the follow-up appointment.   

## 2012-06-08 NOTE — Assessment & Plan Note (Signed)
Stable with no exertional symptoms.  Continue ASA 81, statin, Plavix, ACEI, and beta blocker.

## 2012-06-08 NOTE — Assessment & Plan Note (Addendum)
BP is under good control.   I would like to see Aaron King lose some weight.  We talked about diet and increasing exercise.

## 2012-06-08 NOTE — Assessment & Plan Note (Signed)
Check lipids with goal LDL < 70.   

## 2012-06-14 ENCOUNTER — Telehealth: Payer: Self-pay | Admitting: Cardiology

## 2012-06-14 NOTE — Telephone Encounter (Signed)
Pt calling re lab results, not available by phone all day, pls call around 430pm today if at all possible

## 2012-06-14 NOTE — Telephone Encounter (Signed)
Spoke with pt about his recent lab results.

## 2012-07-31 DIAGNOSIS — Z23 Encounter for immunization: Secondary | ICD-10-CM | POA: Diagnosis not present

## 2012-07-31 DIAGNOSIS — E7800 Pure hypercholesterolemia, unspecified: Secondary | ICD-10-CM | POA: Diagnosis not present

## 2012-07-31 DIAGNOSIS — I10 Essential (primary) hypertension: Secondary | ICD-10-CM | POA: Diagnosis not present

## 2012-08-11 ENCOUNTER — Ambulatory Visit (INDEPENDENT_AMBULATORY_CARE_PROVIDER_SITE_OTHER): Payer: Medicare Other | Admitting: General Surgery

## 2012-08-11 ENCOUNTER — Encounter (INDEPENDENT_AMBULATORY_CARE_PROVIDER_SITE_OTHER): Payer: Self-pay

## 2012-08-11 ENCOUNTER — Encounter (INDEPENDENT_AMBULATORY_CARE_PROVIDER_SITE_OTHER): Payer: Self-pay | Admitting: General Surgery

## 2012-08-11 VITALS — BP 128/78 | HR 56 | Temp 97.7°F | Resp 18 | Ht 68.0 in | Wt 211.0 lb

## 2012-08-11 DIAGNOSIS — R229 Localized swelling, mass and lump, unspecified: Secondary | ICD-10-CM | POA: Diagnosis not present

## 2012-08-11 DIAGNOSIS — R2240 Localized swelling, mass and lump, unspecified lower limb: Secondary | ICD-10-CM

## 2012-08-11 NOTE — Patient Instructions (Signed)
You have a L thigh mass.  We will schedule you for removal ASAP.

## 2012-08-11 NOTE — Progress Notes (Signed)
Chief Complaint  Patient presents with  . New Evaluation    lipoma Left thigh    HISTORY:  Aaron King is a 66 y.o. male who presents to clinic with L thigh mass.  This has been there for several years and is slowly getting bigger.  It is starting to cause some irritation.  He has also notice a bluish discoloration over the area.  Past Medical History  Diagnosis Date  . Hypertension   . CAD (coronary artery disease)     s/p MI july 2011 with DES. Effient stopped due to gum bleeding and epistaxis  . Tobacco user     2 PPD  . Alcohol use     2-3 beers a day  . History of prostate cancer     s/p XRT and seed implantation in May 2010       Past Surgical History  Procedure Date  . Hernia repair 1980    rt inguinal  . Appendectomy 1960  . Prostate surgery 03/31/09    rad/seed implant  . Eye surgery 05/01/09    las      Current Outpatient Prescriptions  Medication Sig Dispense Refill  . aspirin 81 MG EC tablet Take 81 mg by mouth daily.        . atorvastatin (LIPITOR) 40 MG tablet Take 1 tablet (40 mg total) by mouth daily.  30 tablet  6  . clopidogrel (PLAVIX) 75 MG tablet Take 1 tablet (75 mg total) by mouth daily.  90 tablet  3  . lisinopril (PRINIVIL,ZESTRIL) 10 MG tablet Take 10 mg by mouth daily.        . metoprolol succinate (TOPROL-XL) 25 MG 24 hr tablet Take 1 tablet (25 mg total) by mouth daily.  30 tablet  11  . nitroGLYCERIN (NITROSTAT) 0.4 MG SL tablet Place 0.4 mg under the tongue every 5 (five) minutes as needed.        . Tamsulosin HCl (FLOMAX) 0.4 MG CAPS Take 0.4 mg by mouth daily.        . vitamin B-12 (CYANOCOBALAMIN) 1000 MCG tablet Take 1,000 mcg by mouth daily.         Allergies  Allergen Reactions  . Hydrocodone       Family History  Problem Relation Age of Onset  . Heart attack Mother     massive  . Heart disease Mother   . Heart failure Father 95    chf  . Heart disease Father   . Cancer Brother       History   Social History  .  Marital Status: Divorced    Spouse Name: N/A    Number of Children: 0  . Years of Education: N/A   Occupational History  . 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     Social History Main Topics  . Smoking status: Former Smoker -- 2.0 packs/day    Types: Cigarettes    Quit date: 05/14/2010  . Smokeless tobacco: None   Comment: quit May 14 2010  . Alcohol Use: 0.0 oz/week     2-3 beers per day  . Drug Use: No  . Sexually Active: None   Other Topics Concern  . None   Social History Narrative  . None       REVIEW OF SYSTEMS - PERTINENT POSITIVES ONLY: Review of Systems - General ROS: negative for - chills, fever or weight loss Hematological and Lymphatic ROS: negative for -   bleeding problems, blood clots, bruising  (pt on plavix) Respiratory ROS: no cough, shortness of breath, or wheezing Cardiovascular ROS: no chest pain or dyspnea on exertion Gastrointestinal ROS: no abdominal pain, change in bowel habits, or black or bloody stools Musculoskeletal ROS: negative for - joint pain, joint swelling, muscle pain or swelling in thigh - left  EXAM: Filed Vitals:   08/11/12 1512  BP: 128/78  Pulse: 56  Temp: 97.7 F (36.5 C)  Resp: 18    General appearance: alert, cooperative and no distress Resp: clear to auscultation bilaterally Cardio: regular rate and rhythm GI: soft, non-tender; bowel sounds normal; no masses,  no organomegaly Extremities: mass noted on L thigh Skin discoloration noted over mass    ASSESSMENT AND PLAN: We will remove this in the surgery center at [facility].  I will do the surgery while he is on plavix given his recent MI.    Genevie Elman C Ching Rabideau, MD Colon and Rectal Surgery / General Surgery [facility]      Visit Diagnoses: No diagnosis found.  Primary Care Physician: HAMRICK,MAURA L, MD    

## 2012-08-11 NOTE — Progress Notes (Signed)
Per Dr Maisie Fus she wants patient to remain on his Plavix and Aspirin due to his recent heart attack.

## 2012-08-18 ENCOUNTER — Encounter (HOSPITAL_BASED_OUTPATIENT_CLINIC_OR_DEPARTMENT_OTHER): Payer: Self-pay | Admitting: *Deleted

## 2012-08-21 ENCOUNTER — Encounter (HOSPITAL_BASED_OUTPATIENT_CLINIC_OR_DEPARTMENT_OTHER): Payer: Self-pay | Admitting: *Deleted

## 2012-08-21 NOTE — Progress Notes (Signed)
NPO AFTER MN. ARRIVES AT 0915. NEEDS ISTAT. CURRENT EKG IN EPIC AND CHART. WILL TAKE TOPROL AM OF SURG W/ SIP OF WATER.

## 2012-08-23 ENCOUNTER — Ambulatory Visit (HOSPITAL_BASED_OUTPATIENT_CLINIC_OR_DEPARTMENT_OTHER)
Admission: RE | Admit: 2012-08-23 | Discharge: 2012-08-23 | Disposition: A | Discharge status: Home / Self Care | Payer: Medicare Other | Source: Ambulatory Visit | Attending: General Surgery | Admitting: General Surgery

## 2012-08-23 ENCOUNTER — Ambulatory Visit (HOSPITAL_BASED_OUTPATIENT_CLINIC_OR_DEPARTMENT_OTHER): Payer: Medicare Other | Admitting: Anesthesiology

## 2012-08-23 ENCOUNTER — Encounter (HOSPITAL_BASED_OUTPATIENT_CLINIC_OR_DEPARTMENT_OTHER): Payer: Self-pay | Admitting: Anesthesiology

## 2012-08-23 ENCOUNTER — Encounter (HOSPITAL_BASED_OUTPATIENT_CLINIC_OR_DEPARTMENT_OTHER): Payer: Self-pay | Admitting: *Deleted

## 2012-08-23 ENCOUNTER — Encounter (HOSPITAL_BASED_OUTPATIENT_CLINIC_OR_DEPARTMENT_OTHER)
Admission: RE | Disposition: A | Discharge status: Home / Self Care | Payer: Self-pay | Source: Ambulatory Visit | Attending: General Surgery

## 2012-08-23 DIAGNOSIS — F17200 Nicotine dependence, unspecified, uncomplicated: Secondary | ICD-10-CM | POA: Diagnosis not present

## 2012-08-23 DIAGNOSIS — I252 Old myocardial infarction: Secondary | ICD-10-CM | POA: Diagnosis not present

## 2012-08-23 DIAGNOSIS — Z79899 Other long term (current) drug therapy: Secondary | ICD-10-CM | POA: Insufficient documentation

## 2012-08-23 DIAGNOSIS — I10 Essential (primary) hypertension: Secondary | ICD-10-CM | POA: Insufficient documentation

## 2012-08-23 DIAGNOSIS — R229 Localized swelling, mass and lump, unspecified: Secondary | ICD-10-CM | POA: Diagnosis not present

## 2012-08-23 DIAGNOSIS — L723 Sebaceous cyst: Secondary | ICD-10-CM | POA: Insufficient documentation

## 2012-08-23 DIAGNOSIS — Z8546 Personal history of malignant neoplasm of prostate: Secondary | ICD-10-CM | POA: Diagnosis not present

## 2012-08-23 DIAGNOSIS — L720 Epidermal cyst: Secondary | ICD-10-CM | POA: Insufficient documentation

## 2012-08-23 DIAGNOSIS — I2510 Atherosclerotic heart disease of native coronary artery without angina pectoris: Secondary | ICD-10-CM | POA: Insufficient documentation

## 2012-08-23 DIAGNOSIS — Z7982 Long term (current) use of aspirin: Secondary | ICD-10-CM | POA: Insufficient documentation

## 2012-08-23 HISTORY — PX: MASS EXCISION: SHX2000

## 2012-08-23 HISTORY — DX: Hyperlipidemia, unspecified: E78.5

## 2012-08-23 HISTORY — DX: Presence of coronary angioplasty implant and graft: Z95.5

## 2012-08-23 LAB — POCT I-STAT, CHEM 8
Calcium, Ion: 1.23 mmol/L (ref 1.13–1.30)
HCT: 41 % (ref 39.0–52.0)
Hemoglobin: 13.9 g/dL (ref 13.0–17.0)
Sodium: 140 mEq/L (ref 135–145)
TCO2: 23 mmol/L (ref 0–100)

## 2012-08-23 SURGERY — EXCISION MASS
Anesthesia: Monitor Anesthesia Care | Site: Thigh | Laterality: Left | Wound class: Clean

## 2012-08-23 MED ORDER — FENTANYL CITRATE 0.05 MG/ML IJ SOLN
INTRAMUSCULAR | Status: DC | PRN
  Administered 2012-08-23 (×2): 25 ug via INTRAVENOUS
  Administered 2012-08-23: 50 ug via INTRAVENOUS
  Administered 2012-08-23 (×2): 25 ug via INTRAVENOUS

## 2012-08-23 MED ORDER — ONDANSETRON HCL 4 MG/2ML IJ SOLN: 4.0000 mg | Freq: Four times a day (QID) | INTRAMUSCULAR | Status: DC | PRN

## 2012-08-23 MED ORDER — LACTATED RINGERS IV SOLN: INTRAVENOUS | Status: DC

## 2012-08-23 MED ORDER — ACETAMINOPHEN 325 MG PO TABS
650.0000 mg | ORAL_TABLET | ORAL | Status: DC | PRN
Start: 2012-08-23 — End: 2012-08-23

## 2012-08-23 MED ORDER — EPHEDRINE SULFATE 50 MG/ML IJ SOLN
INTRAMUSCULAR | Status: DC | PRN
  Administered 2012-08-23: 10 mg via INTRAVENOUS

## 2012-08-23 MED ORDER — CHLORHEXIDINE GLUCONATE 4 % EX LIQD: 1.0000 "application " | Freq: Once | CUTANEOUS | Status: DC

## 2012-08-23 MED ORDER — ACETAMINOPHEN 650 MG RE SUPP: 650.0000 mg | RECTAL | Status: DC | PRN

## 2012-08-23 MED ORDER — DEXAMETHASONE SODIUM PHOSPHATE 4 MG/ML IJ SOLN
INTRAMUSCULAR | Status: DC | PRN
  Administered 2012-08-23: 8 mg via INTRAVENOUS

## 2012-08-23 MED ORDER — MEPERIDINE HCL 25 MG/ML IJ SOLN: 6.2500 mg | INTRAMUSCULAR | Status: DC | PRN

## 2012-08-23 MED ORDER — BUPIVACAINE-EPINEPHRINE 0.5% -1:200000 IJ SOLN
INTRAMUSCULAR | Status: DC | PRN
  Administered 2012-08-23: 15 mL

## 2012-08-23 MED ORDER — PROMETHAZINE HCL 25 MG/ML IJ SOLN: 6.2500 mg | INTRAMUSCULAR | Status: DC | PRN

## 2012-08-23 MED ORDER — FENTANYL CITRATE 0.05 MG/ML IJ SOLN: 25.0000 ug | INTRAMUSCULAR | Status: DC | PRN

## 2012-08-23 MED ORDER — SODIUM CHLORIDE 0.9 % IJ SOLN: 3.0000 mL | INTRAMUSCULAR | Status: DC | PRN

## 2012-08-23 MED ORDER — ONDANSETRON HCL 4 MG/2ML IJ SOLN
INTRAMUSCULAR | Status: DC | PRN
  Administered 2012-08-23: 4 mg via INTRAVENOUS

## 2012-08-23 MED ORDER — HYDROCODONE-ACETAMINOPHEN 5-325 MG PO TABS: 1.0000 | ORAL_TABLET | ORAL | Status: DC | PRN

## 2012-08-23 MED ORDER — LACTATED RINGERS IV SOLN
INTRAVENOUS | Status: DC
  Administered 2012-08-23: 09:00:00 via INTRAVENOUS

## 2012-08-23 MED ORDER — SODIUM CHLORIDE 0.9 % IV SOLN: 250.0000 mL | INTRAVENOUS | Status: DC | PRN

## 2012-08-23 MED ORDER — SODIUM CHLORIDE 0.9 % IJ SOLN: 3.0000 mL | Freq: Two times a day (BID) | INTRAMUSCULAR | Status: DC

## 2012-08-23 MED ORDER — SODIUM CHLORIDE 0.9 % IR SOLN
Status: DC | PRN
  Administered 2012-08-23: 500 mL

## 2012-08-23 MED ORDER — CEFAZOLIN SODIUM-DEXTROSE 2-3 GM-% IV SOLR
2.0000 g | INTRAVENOUS | Status: AC
  Administered 2012-08-23: 2 g via INTRAVENOUS

## 2012-08-23 MED ORDER — PROPOFOL 10 MG/ML IV EMUL
INTRAVENOUS | Status: DC | PRN
  Administered 2012-08-23: 50 ug/kg/min via INTRAVENOUS

## 2012-08-23 MED ORDER — MIDAZOLAM HCL 5 MG/5ML IJ SOLN
INTRAMUSCULAR | Status: DC | PRN
  Administered 2012-08-23: 1 mg via INTRAVENOUS

## 2012-08-23 MED ORDER — DIPHENHYDRAMINE HCL 50 MG/ML IJ SOLN
INTRAMUSCULAR | Status: DC | PRN
  Administered 2012-08-23 (×2): 12.5 mg via INTRAVENOUS

## 2012-08-23 SURGICAL SUPPLY — 30 items
BLADE SURG 10 STRL SS (BLADE) ×2 IMPLANT
BLADE SURG 15 STRL LF DISP TIS (BLADE) ×1 IMPLANT
BLADE SURG 15 STRL SS (BLADE) ×1
CHLORAPREP W/TINT 26ML (MISCELLANEOUS) ×2 IMPLANT
COVER MAYO STAND STRL (DRAPES) ×2 IMPLANT
COVER TABLE BACK 60X90 (DRAPES) ×2 IMPLANT
DERMABOND ADVANCED (GAUZE/BANDAGES/DRESSINGS) ×1
DERMABOND ADVANCED .7 DNX12 (GAUZE/BANDAGES/DRESSINGS) ×1 IMPLANT
DRAPE PED LAPAROTOMY (DRAPES) ×2 IMPLANT
ELECT REM PT RETURN 9FT ADLT (ELECTROSURGICAL) ×2
ELECTRODE REM PT RTRN 9FT ADLT (ELECTROSURGICAL) ×1 IMPLANT
GLOVE BIO SURGEON STRL SZ 6.5 (GLOVE) ×2 IMPLANT
GLOVE BIOGEL PI IND STRL 6.5 (GLOVE) ×2 IMPLANT
GLOVE BIOGEL PI INDICATOR 6.5 (GLOVE) ×2
GLOVE INDICATOR 7.0 STRL GRN (GLOVE) ×2 IMPLANT
GOWN PREVENTION PLUS XLARGE (GOWN DISPOSABLE) ×2 IMPLANT
GOWN PREVENTION PLUS XXLARGE (GOWN DISPOSABLE) ×2 IMPLANT
MARKER SKIN DUAL TIP RULER LAB (MISCELLANEOUS) ×2 IMPLANT
NEEDLE HYPO 25X1 1.5 SAFETY (NEEDLE) ×2 IMPLANT
NS IRRIG 500ML POUR BTL (IV SOLUTION) ×2 IMPLANT
PACK BASIN DAY SURGERY FS (CUSTOM PROCEDURE TRAY) ×2 IMPLANT
PENCIL BUTTON HOLSTER BLD 10FT (ELECTRODE) ×2 IMPLANT
SPONGE GAUZE 4X4 12PLY (GAUZE/BANDAGES/DRESSINGS) ×2 IMPLANT
SUT VIC AB 2-0 SH 27 (SUTURE) ×1
SUT VIC AB 2-0 SH 27XBRD (SUTURE) ×1 IMPLANT
SUT VICRYL 4-0 PS2 18IN ABS (SUTURE) ×2 IMPLANT
SYR CONTROL 10ML LL (SYRINGE) ×2 IMPLANT
TOWEL OR 17X24 6PK STRL BLUE (TOWEL DISPOSABLE) ×4 IMPLANT
UNDERPAD 30X30 INCONTINENT (UNDERPADS AND DIAPERS) ×2 IMPLANT
YANKAUER SUCT BULB TIP NO VENT (SUCTIONS) ×2 IMPLANT

## 2012-08-23 NOTE — Anesthesia Postprocedure Evaluation (Signed)
  Anesthesia Post-op Note  Patient: Aaron King  Procedure(s) Performed: Procedure(s) (LRB): EXCISION MASS (Left)  Patient Location: PACU  Anesthesia Type: MAC  Level of Consciousness: awake and alert   Airway and Oxygen Therapy: Patient Spontanous Breathing  Post-op Pain: mild  Post-op Assessment: Post-op Vital signs reviewed, Patient's Cardiovascular Status Stable, Respiratory Function Stable, Patent Airway and No signs of Nausea or vomiting  Post-op Vital Signs: stable  Complications: No apparent anesthesia complications

## 2012-08-23 NOTE — Transfer of Care (Signed)
Immediate Anesthesia Transfer of Care Note  Patient: Aaron King  Procedure(s) Performed: Procedure(s) (LRB): EXCISION MASS (Left)  Patient Location: Patient transported to PACU with oxygen via face mask at 3 Liters / Min  Anesthesia Type: MAC  Level of Consciousness: awake and alert   Airway & Oxygen Therapy: Patient Spontanous Breathing and Patient connected to face mask oxygen  Post-op Assessment: Report given to PACU RN and Post -op Vital signs reviewed and stable  Post vital signs: Reviewed and stable  Complications: No apparent anesthesia complications

## 2012-08-23 NOTE — H&P (View-Only) (Signed)
Chief Complaint  Patient presents with  . New Evaluation    lipoma Left thigh    HISTORY:  Aaron King is a 66 y.o. male who presents to clinic with L thigh mass.  This has been there for several years and is slowly getting bigger.  It is starting to cause some irritation.  He has also notice a bluish discoloration over the area.  Past Medical History  Diagnosis Date  . Hypertension   . CAD (coronary artery disease)     s/p MI july 2011 with DES. Effient stopped due to gum bleeding and epistaxis  . Tobacco user     2 PPD  . Alcohol use     2-3 beers a day  . History of prostate cancer     s/p XRT and seed implantation in May 2010       Past Surgical History  Procedure Date  . Hernia repair 1980    rt inguinal  . Appendectomy 1960  . Prostate surgery 03/31/09    rad/seed implant  . Eye surgery 05/01/09    las      Current Outpatient Prescriptions  Medication Sig Dispense Refill  . aspirin 81 MG EC tablet Take 81 mg by mouth daily.        Marland Kitchen atorvastatin (LIPITOR) 40 MG tablet Take 1 tablet (40 mg total) by mouth daily.  30 tablet  6  . clopidogrel (PLAVIX) 75 MG tablet Take 1 tablet (75 mg total) by mouth daily.  90 tablet  3  . lisinopril (PRINIVIL,ZESTRIL) 10 MG tablet Take 10 mg by mouth daily.        . metoprolol succinate (TOPROL-XL) 25 MG 24 hr tablet Take 1 tablet (25 mg total) by mouth daily.  30 tablet  11  . nitroGLYCERIN (NITROSTAT) 0.4 MG SL tablet Place 0.4 mg under the tongue every 5 (five) minutes as needed.        . Tamsulosin HCl (FLOMAX) 0.4 MG CAPS Take 0.4 mg by mouth daily.        . vitamin B-12 (CYANOCOBALAMIN) 1000 MCG tablet Take 1,000 mcg by mouth daily.         Allergies  Allergen Reactions  . Hydrocodone       Family History  Problem Relation Age of Onset  . Heart attack Mother     massive  . Heart disease Mother   . Heart failure Father 9    chf  . Heart disease Father   . Cancer Brother       History   Social History  .  Marital Status: Divorced    Spouse Name: N/A    Number of Children: 0  . Years of Education: N/A   Occupational History  . 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     Social History Main Topics  . Smoking status: Former Smoker -- 2.0 packs/day    Types: Cigarettes    Quit date: 05/14/2010  . Smokeless tobacco: None   Comment: quit May 14 2010  . Alcohol Use: 0.0 oz/week     2-3 beers per day  . Drug Use: No  . Sexually Active: None   Other Topics Concern  . None   Social History Narrative  . None       REVIEW OF SYSTEMS - PERTINENT POSITIVES ONLY: Review of Systems - General ROS: negative for - chills, fever or weight loss Hematological and Lymphatic ROS: negative for -  bleeding problems, blood clots, bruising  (pt on plavix) Respiratory ROS: no cough, shortness of breath, or wheezing Cardiovascular ROS: no chest pain or dyspnea on exertion Gastrointestinal ROS: no abdominal pain, change in bowel habits, or black or bloody stools Musculoskeletal ROS: negative for - joint pain, joint swelling, muscle pain or swelling in thigh - left  EXAM: Filed Vitals:   08/11/12 1512  BP: 128/78  Pulse: 56  Temp: 97.7 F (36.5 C)  Resp: 18    General appearance: alert, cooperative and no distress Resp: clear to auscultation bilaterally Cardio: regular rate and rhythm GI: soft, non-tender; bowel sounds normal; no masses,  no organomegaly Extremities: mass noted on L thigh Skin discoloration noted over mass    ASSESSMENT AND PLAN: We will remove this in the surgery center at [facility].  I will do the surgery while he is on plavix given his recent MI.    Vanita Panda, MD Colon and Rectal Surgery / General Surgery [facility]      Visit Diagnoses: No diagnosis found.  Primary Care Physician: Ailene Ravel, MD

## 2012-08-23 NOTE — Interval H&P Note (Signed)
History and Physical Interval Note:  08/23/2012 12:38 PM  Aaron King  has presented today for surgery, with the diagnosis of left thigh mass  The various methods of treatment have been discussed with the patient and family. After consideration of risks, benefits and other options for treatment, the patient has consented to  Procedure(s) (LRB) with comments: EXCISION MASS (Left) - excisional biopsy left thigh mass as a surgical intervention .  The patient's history has been reviewed, patient examined, no change in status, stable for surgery.  I have reviewed the patient's chart and labs.  Questions were answered to the patient's satisfaction.     Vanita Panda, MD  Colorectal and General Surgery [facility]

## 2012-08-23 NOTE — Op Note (Signed)
08/23/2012  12:28 PM  PATIENT:  Aaron King  66 y.o. male  Patient Care Team: Ailene Ravel as PCP - General (Family Medicine)  PRE-OPERATIVE DIAGNOSIS:  left thigh mass  POST-OPERATIVE DIAGNOSIS: sebaceous cyst PROCEDURE:  Procedure(s): EXCISION MASS  SURGEON:  Surgeon(s): Romie Levee, MD  ANESTHESIA:   local and IV sedation  EBL:  Total I/O In: 990 [P.O.:240; I.V.:750] Out: -   Delay start of Pharmacological VTE agent (>24hrs) due to surgical blood loss or risk of bleeding:  not applicable  DRAINS: none   SPECIMEN:  Source of Specimen:  L ant thigh  DISPOSITION OF SPECIMEN:  PATHOLOGY  COUNTS:  YES  PLAN OF CARE: Discharge to home after PACU  PATIENT DISPOSITION:  PACU - hemodynamically stable.  INDICATION: this is a 66 year old male who presented to my office with a mass on his left thigh. This has been enlarging for several years. The risk and benefits of excision of this mass were explained to the patient prior the OR and consent was signed and placed on chart.  OR FINDINGS: large sebaceous cyst  DESCRIPTION: the patient was identified in the preoperative holding area and taken to the operating room where he was laid supine on the operating room table. IV sedation was begun. The patient's left eye was then prepped and draped in usual sterile fashion. A surgical timeout was performed indicating the correct patient, procedure, positioning and preoperative antibiotics. SCDs were noted be in place prior to the initiation of anesthesia.  An elliptical incision was made over the mass using a 10 blade scalpel. This was dissected free from the mass using blunt dissection.  Once completely free from surrounding tissues it was sent to pathology for further examination. Hemostasis was achieved using Bovie electrocautery.  The dermal layer was then closed using 2-0 Vicryl sutures in interrupted fashion. The skin was closed using a 4-0 Vicryl suture and Dermabond.   Approximately 18 mL of half percent Marcaine with epinephrine were used as subcutaneous local anesthesia. All counts were correct per operative staff.  I have discussed these findings with the patient. He will see me back in 2 weeks. We will await the final pathology.

## 2012-08-23 NOTE — Anesthesia Preprocedure Evaluation (Signed)
Anesthesia Evaluation  Patient identified by MRN, date of birth, ID band Patient awake    Reviewed: Allergy & Precautions, H&P , NPO status , Patient's Chart, lab work & pertinent test results  Airway Mallampati: II TM Distance: >3 FB Neck ROM: Full    Dental No notable dental hx.    Pulmonary neg pulmonary ROS, former smoker,  breath sounds clear to auscultation  Pulmonary exam normal       Cardiovascular hypertension, Pt. on medications + CAD and + Cardiac Stents (2011) Rhythm:Regular Rate:Normal     Neuro/Psych negative neurological ROS  negative psych ROS   GI/Hepatic negative GI ROS, Neg liver ROS,   Endo/Other  negative endocrine ROS  Renal/GU negative Renal ROS  negative genitourinary   Musculoskeletal negative musculoskeletal ROS (+)   Abdominal   Peds negative pediatric ROS (+)  Hematology negative hematology ROS (+)   Anesthesia Other Findings   Reproductive/Obstetrics negative OB ROS                           Anesthesia Physical Anesthesia Plan  ASA: III  Anesthesia Plan: MAC   Post-op Pain Management:    Induction: Intravenous  Airway Management Planned: Simple Face Mask  Additional Equipment:   Intra-op Plan:   Post-operative Plan:   Informed Consent: I have reviewed the patients History and Physical, chart, labs and discussed the procedure including the risks, benefits and alternatives for the proposed anesthesia with the patient or authorized representative who has indicated his/her understanding and acceptance.   Dental advisory given  Plan Discussed with: CRNA  Anesthesia Plan Comments:         Anesthesia Quick Evaluation

## 2012-08-24 ENCOUNTER — Encounter (HOSPITAL_BASED_OUTPATIENT_CLINIC_OR_DEPARTMENT_OTHER): Payer: Self-pay | Admitting: General Surgery

## 2012-09-07 ENCOUNTER — Encounter (INDEPENDENT_AMBULATORY_CARE_PROVIDER_SITE_OTHER): Payer: MEDICARE | Admitting: General Surgery

## 2012-09-12 ENCOUNTER — Ambulatory Visit (INDEPENDENT_AMBULATORY_CARE_PROVIDER_SITE_OTHER): Payer: Medicare Other | Admitting: General Surgery

## 2012-09-12 ENCOUNTER — Encounter (INDEPENDENT_AMBULATORY_CARE_PROVIDER_SITE_OTHER): Payer: Self-pay | Admitting: General Surgery

## 2012-09-12 VITALS — BP 122/68 | HR 72 | Temp 97.8°F | Resp 18 | Ht 68.0 in | Wt 211.1 lb

## 2012-09-12 DIAGNOSIS — L720 Epidermal cyst: Secondary | ICD-10-CM

## 2012-09-12 DIAGNOSIS — L723 Sebaceous cyst: Secondary | ICD-10-CM

## 2012-09-12 NOTE — Progress Notes (Signed)
Aaron King is a 66 y.o. male who is status post a excision of a sebaceous cyst on 10/16.  He is doing well.  He has one area of erythema around the suture knot that causes him a little pain.  Objective: Filed Vitals:   09/12/12 1150  BP: 122/68  Pulse: 72  Temp: 97.8 F (36.6 C)  Resp: 18    General appearance: alert and no distress  Incision: healing well   Assessment: s/p  Patient Active Problem List  Diagnosis  . PROSTATE CANCER  . HYPERLIPIDEMIA-MIXED  . ALCOHOL ABUSE  . TOBACCO USER  . HYPERTENSION  . CAD, NATIVE VESSEL  . RASH AND OTHER NONSPECIFIC SKIN ERUPTION    Suture reaction- knot removed  Plan: Doing well after surgery.  F/U PRN.  A copy of the path report was given to the patient.      Vanita Panda, MD [facility], [place] [phone]   09/12/2012 12:39 PM

## 2012-09-12 NOTE — Patient Instructions (Signed)
Follow up PRN

## 2012-11-02 DIAGNOSIS — C61 Malignant neoplasm of prostate: Secondary | ICD-10-CM | POA: Diagnosis not present

## 2012-11-02 DIAGNOSIS — N529 Male erectile dysfunction, unspecified: Secondary | ICD-10-CM | POA: Diagnosis not present

## 2013-01-23 ENCOUNTER — Other Ambulatory Visit: Payer: Self-pay | Admitting: *Deleted

## 2013-01-23 MED ORDER — CLOPIDOGREL BISULFATE 75 MG PO TABS: 75.0000 mg | ORAL_TABLET | Freq: Every day | ORAL | Status: DC

## 2013-01-29 DIAGNOSIS — E7800 Pure hypercholesterolemia, unspecified: Secondary | ICD-10-CM | POA: Diagnosis not present

## 2013-01-29 DIAGNOSIS — I10 Essential (primary) hypertension: Secondary | ICD-10-CM | POA: Diagnosis not present

## 2013-01-29 DIAGNOSIS — I252 Old myocardial infarction: Secondary | ICD-10-CM | POA: Diagnosis not present

## 2013-01-29 DIAGNOSIS — Z79899 Other long term (current) drug therapy: Secondary | ICD-10-CM | POA: Diagnosis not present

## 2013-01-29 DIAGNOSIS — Z6834 Body mass index (BMI) 34.0-34.9, adult: Secondary | ICD-10-CM | POA: Diagnosis not present

## 2013-04-16 DIAGNOSIS — C61 Malignant neoplasm of prostate: Secondary | ICD-10-CM | POA: Diagnosis not present

## 2013-07-06 DIAGNOSIS — Z1331 Encounter for screening for depression: Secondary | ICD-10-CM | POA: Diagnosis not present

## 2013-07-06 DIAGNOSIS — Z9181 History of falling: Secondary | ICD-10-CM | POA: Diagnosis not present

## 2013-07-06 DIAGNOSIS — J069 Acute upper respiratory infection, unspecified: Secondary | ICD-10-CM | POA: Diagnosis not present

## 2013-08-01 ENCOUNTER — Encounter: Payer: Self-pay | Admitting: Cardiology

## 2013-08-01 DIAGNOSIS — I252 Old myocardial infarction: Secondary | ICD-10-CM | POA: Diagnosis not present

## 2013-08-01 DIAGNOSIS — Z79899 Other long term (current) drug therapy: Secondary | ICD-10-CM | POA: Diagnosis not present

## 2013-08-01 DIAGNOSIS — J449 Chronic obstructive pulmonary disease, unspecified: Secondary | ICD-10-CM | POA: Diagnosis not present

## 2013-08-01 DIAGNOSIS — I10 Essential (primary) hypertension: Secondary | ICD-10-CM | POA: Diagnosis not present

## 2013-08-01 DIAGNOSIS — E7800 Pure hypercholesterolemia, unspecified: Secondary | ICD-10-CM | POA: Diagnosis not present

## 2013-08-02 ENCOUNTER — Encounter (HOSPITAL_COMMUNITY): Payer: Self-pay

## 2013-08-02 ENCOUNTER — Emergency Department (HOSPITAL_COMMUNITY): Payer: Medicare Other

## 2013-08-02 ENCOUNTER — Emergency Department (HOSPITAL_COMMUNITY)
Admission: EM | Admit: 2013-08-02 | Discharge: 2013-08-02 | Disposition: A | Discharge status: Home / Self Care | Payer: Medicare Other | Attending: Emergency Medicine | Admitting: Emergency Medicine

## 2013-08-02 DIAGNOSIS — I252 Old myocardial infarction: Secondary | ICD-10-CM | POA: Diagnosis not present

## 2013-08-02 DIAGNOSIS — Z87891 Personal history of nicotine dependence: Secondary | ICD-10-CM | POA: Insufficient documentation

## 2013-08-02 DIAGNOSIS — Z8546 Personal history of malignant neoplasm of prostate: Secondary | ICD-10-CM | POA: Diagnosis not present

## 2013-08-02 DIAGNOSIS — Z79899 Other long term (current) drug therapy: Secondary | ICD-10-CM | POA: Insufficient documentation

## 2013-08-02 DIAGNOSIS — Z9861 Coronary angioplasty status: Secondary | ICD-10-CM | POA: Insufficient documentation

## 2013-08-02 DIAGNOSIS — I10 Essential (primary) hypertension: Secondary | ICD-10-CM | POA: Insufficient documentation

## 2013-08-02 DIAGNOSIS — E785 Hyperlipidemia, unspecified: Secondary | ICD-10-CM | POA: Insufficient documentation

## 2013-08-02 DIAGNOSIS — M549 Dorsalgia, unspecified: Secondary | ICD-10-CM | POA: Diagnosis not present

## 2013-08-02 DIAGNOSIS — E7800 Pure hypercholesterolemia, unspecified: Secondary | ICD-10-CM | POA: Diagnosis present

## 2013-08-02 DIAGNOSIS — R079 Chest pain, unspecified: Secondary | ICD-10-CM

## 2013-08-02 DIAGNOSIS — I2510 Atherosclerotic heart disease of native coronary artery without angina pectoris: Secondary | ICD-10-CM | POA: Diagnosis not present

## 2013-08-02 DIAGNOSIS — I200 Unstable angina: Secondary | ICD-10-CM

## 2013-08-02 DIAGNOSIS — Z7902 Long term (current) use of antithrombotics/antiplatelets: Secondary | ICD-10-CM | POA: Diagnosis not present

## 2013-08-02 DIAGNOSIS — R0602 Shortness of breath: Secondary | ICD-10-CM | POA: Diagnosis not present

## 2013-08-02 HISTORY — DX: Alcohol abuse, uncomplicated: F10.10

## 2013-08-02 LAB — BASIC METABOLIC PANEL
CO2: 28 mEq/L (ref 19–32)
Calcium: 9.2 mg/dL (ref 8.4–10.5)
Creatinine, Ser: 0.8 mg/dL (ref 0.50–1.35)
GFR calc Af Amer: >90 mL/min (ref >90)
GFR calc non Af Amer: >90 mL/min (ref >90)
Potassium: 3.9 mEq/L (ref 3.5–5.1)
Sodium: 139 mEq/L (ref 135–145)

## 2013-08-02 LAB — CBC
MCH: 33.2 pg (ref 26.0–34.0)
MCV: 95.4 fL (ref 78.0–100.0)
Platelets: 291 10*3/uL (ref 150–400)
RBC: 3.89 MIL/uL — ABNORMAL LOW (ref 4.22–5.81)
RDW: 13.2 % (ref 11.5–15.5)

## 2013-08-02 LAB — POCT I-STAT TROPONIN I: Troponin i, poc: 0 ng/mL (ref 0.00–0.08)

## 2013-08-02 LAB — PRO B NATRIURETIC PEPTIDE: Pro B Natriuretic peptide (BNP): 124.1 pg/mL (ref 0–125)

## 2013-08-02 MED ORDER — ASPIRIN 325 MG PO TABS
325.0000 mg | ORAL_TABLET | ORAL | Status: DC
  Filled 2013-08-02: qty 1

## 2013-08-02 MED ORDER — NITROGLYCERIN 0.4 MG SL SUBL
0.4000 mg | SUBLINGUAL_TABLET | SUBLINGUAL | Status: DC | PRN
  Administered 2013-08-02 (×2): 0.4 mg via SUBLINGUAL

## 2013-08-02 MED ORDER — NITROGLYCERIN 0.3 MG SL SUBL: 0.3000 mg | SUBLINGUAL_TABLET | SUBLINGUAL | Status: DC | PRN

## 2013-08-02 NOTE — ED Provider Notes (Signed)
CSN: [id number]     Arrival date & time 08/02/13  1511 History   First MD Initiated Contact with Patient 08/02/13 1521     Chief Complaint  Patient presents with  . Shoulder Pain  . Shortness of Breath   (Consider location/radiation/quality/duration/timing/severity/associated sxs/prior Treatment) Patient is a 67 y.o. male presenting with shoulder pain. The history is provided by the patient.  Shoulder Pain This is a new problem. The current episode started 6 to 12 hours ago. The problem occurs constantly. The problem has not changed since onset.Pertinent negatives include no chest pain, no abdominal pain, no headaches and no shortness of breath. Exacerbated by: turning head to left or right. Nothing relieves the symptoms. He has tried nothing for the symptoms. The treatment provided no relief.    Past Medical History  Diagnosis Date  . Hypertension   . History of prostate cancer     s/p XRT and seed implantation in May 2010  . History of MI (myocardial infarction) 05-14-2010--  NON- STEMI  S/P STENT X1  . S/P drug eluting coronary stent placement 05-15-2010  . CAD (coronary artery disease) CARDIOL.OGIST- DR  MCLEAN-- LOV 06-07-2012 IN [facility]    s/p MI july 2011 with DES. Effient stopped due to gum bleeding and epistaxis  . Dyslipidemia   . Mass of thigh LEFT   Past Surgical History  Procedure Laterality Date  . Radioactive prostate seed implants  03-31-2009    PROSTATE CANCER  . Repair right inguinal hernia  1980  . Appendectomy  1960  . Coronary angioplasty with stent placement  05-15-2010  DR CRENSHAW    PCI W/ DRUG-ELUTING STENT X1 IN THE FIRST OBTUSE MARGINAL BRANCH OF CIRCUMFLEX ARTERY/ NON-STEMI/  EF 65%  . Mass excision  08/23/2012    Procedure: EXCISION MASS;  Surgeon: Romie Levee, MD;  Location: [facility];  Service: General;  Laterality: Left;  excisional biopsy left thigh mass   Family History  Problem Relation Age of Onset  . Heart attack Mother    massive  . Heart disease Mother   . Heart failure Father 3    chf  . Heart disease Father   . Cancer Brother    History  Substance Use Topics  . Smoking status: Former Smoker -- 2.00 packs/day for 30 years    Types: Cigarettes    Quit date: 05/14/2010  . Smokeless tobacco: Never Used  . Alcohol Use: 8.4 oz/week    14 Cans of beer per week    Review of Systems  Constitutional: Negative for fever.  HENT: Negative for rhinorrhea, drooling and neck pain.   Eyes: Negative for pain.  Respiratory: Negative for cough and shortness of breath.   Cardiovascular: Negative for chest pain and leg swelling.  Gastrointestinal: Negative for nausea, vomiting, abdominal pain and diarrhea.  Genitourinary: Negative for dysuria and hematuria.  Musculoskeletal: Negative for gait problem.  Skin: Negative for color change.  Neurological: Negative for numbness and headaches.  Hematological: Negative for adenopathy.  Psychiatric/Behavioral: Negative for behavioral problems.  All other systems reviewed and are negative.    Allergies  Oxycodone and Oxycontin  Home Medications   Current Outpatient Rx  Name  Route  Sig  Dispense  Refill  . atorvastatin (LIPITOR) 40 MG tablet   Oral   Take 40 mg by mouth every evening.         . clopidogrel (PLAVIX) 75 MG tablet   Oral   Take 1 tablet (75 mg total) by  mouth daily.   90 tablet   3   . lisinopril (PRINIVIL,ZESTRIL) 10 MG tablet   Oral   Take 10 mg by mouth daily.          Marland Kitchen loratadine (CLARITIN) 10 MG tablet   Oral   Take 10 mg by mouth daily as needed for allergies.         . metoprolol succinate (TOPROL-XL) 25 MG 24 hr tablet   Oral   Take 25 mg by mouth every morning.         . Tamsulosin HCl (FLOMAX) 0.4 MG CAPS   Oral   Take 0.4 mg by mouth daily.          . vitamin B-12 (CYANOCOBALAMIN) 1000 MCG tablet   Oral   Take 1,000 mcg by mouth daily.         . nitroGLYCERIN (NITROSTAT) 0.4 MG SL tablet   Sublingual    Place 0.4 mg under the tongue every 5 (five) minutes as needed.           BP 116/67  Pulse 73  Temp(Src) 97.5 F (36.4 C) (Oral)  Resp 20  Ht 5\' 8"  (1.727 m)  Wt 228 lb (103.42 kg)  BMI 34.68 kg/m2  SpO2 97% Physical Exam  Nursing note and vitals reviewed. Constitutional: He is oriented to person, place, and time. He appears well-developed and well-nourished.  HENT:  Head: Normocephalic and atraumatic.  Right Ear: External ear normal.  Left Ear: External ear normal.  Nose: Nose normal.  Mouth/Throat: Oropharynx is clear and moist. No oropharyngeal exudate.  Eyes: Conjunctivae and EOM are normal. Pupils are equal, round, and reactive to light.  Neck: Normal range of motion. Neck supple.  Cardiovascular: Normal rate, regular rhythm, normal heart sounds and intact distal pulses.  Exam reveals no gallop and no friction rub.   No murmur heard. Pulmonary/Chest: Effort normal and breath sounds normal. No respiratory distress. He has no wheezes.  Abdominal: Soft. Bowel sounds are normal. He exhibits no distension. There is no tenderness. There is no rebound and no guarding.  Musculoskeletal: Normal range of motion. He exhibits no tenderness.  Mild non pitting edema in bilateral distal LE's.   Neurological: He is alert and oriented to person, place, and time.  Skin: Skin is warm and dry.  Psychiatric: He has a normal mood and affect. His behavior is normal.    ED Course  Procedures (including critical care time) Labs Review Labs Reviewed  CBC - Abnormal; Notable for the following:    RBC 3.89 (*)    Hemoglobin 12.9 (*)    HCT 37.1 (*)    All other components within normal limits  BASIC METABOLIC PANEL  PRO B NATRIURETIC PEPTIDE  TROPONIN I  POCT I-STAT TROPONIN I   Imaging Review Dg Chest Port 1 View  08/02/2013   CLINICAL DATA:  Patient reports having bilateral posterior shoulder pain this morning with some shortness of breath.  EXAM: PORTABLE CHEST - 1 VIEW  COMPARISON:   05/14/2010  FINDINGS: The heart size and mediastinal contours are within normal limits. Both lungs are clear. The visualized skeletal structures are unremarkable.  IMPRESSION: No active disease.   Electronically Signed   By: Amie Portland   On: 08/02/2013 15:39     Date: 08/02/2013  Rate: 79  Rhythm: normal sinus rhythm  QRS Axis: normal  Intervals: normal  ST/T Wave abnormalities: nonspecific ST changes  Conduction Disutrbances:none  Narrative Interpretation: Mild low voltage, Isolated mild  ST dep in V4  Old EKG Reviewed: changes noted   MDM   1. CAD (coronary artery disease)   2. Chest pain    3:41 PM 67 y.o. male w hx of MI s/p PCI on plavix who pw dull ache between his should blades upon awakening this am. Pt notes same pain w/ MI in 2011. Pain 6/10 currently. AFVSS here. Will give ASA, nitro, get labs.   Labs/imaging/ecg non-contrib. Consulted cards for eval.   Cards recommends delta trop and d/c home if neg w/ close f/u w/ Dr. Sharol Harness. Will rec nsaids for suspected msk pain.   8:34 PM: Pt feeling better on exam, pain barely noticeable on exam. Delta trop neg.  I have discussed the diagnosis/risks/treatment options with the patient and family and believe the pt to be eligible for discharge home to follow-up with Dr. Sharol Harness in the next 2-3 days. We also discussed returning to the ED immediately if new or worsening sx occur. We discussed the sx which are most concerning (e.g., return of pain, sob, diaphoresis, vomiting) that necessitate immediate return. Any new prescriptions provided to the patient are listed below.  New Prescriptions   No medications on file     Junius Argyle, MD 08/03/13 1402

## 2013-08-02 NOTE — ED Notes (Signed)
Pt here for sob and pain between shoulder blades, pt reports similar feeling when he had previous heart attack in 2011, sts onset this am when he woke up.

## 2013-08-02 NOTE — ED Notes (Signed)
Pt discharged.Vital signs stable and GCS 15 

## 2013-08-02 NOTE — H&P (Signed)
Patient ID: Aaron King MRN: [id number], DOB/AGE: 66-Jul-1947   Admit date: 08/02/2013  Primary Physician: Ailene Ravel, MD Primary Cardiologist: Golden Circle, MD   Pt. Profile:  67 y/o male with h/o CAD s/p NSTEMI and DES to the OM in 05/2010 who presents with recurrent angina.  Problem List  Past Medical History  Diagnosis Date  . Hypertension   . History of prostate cancer     s/p XRT and seed implantation in May 2010  . CAD (coronary artery disease)     a. 05/2010 NSTEMI/Cath/PCI: LM 20, LAD min irregs, LCX min irregs, OM1 99(2.5x12 Promus DES), RCA 30p, EF 65%;  b. effient d/c 2/2 to gum bleeding and epistaxis  . Dyslipidemia   . Left Thigh Mass   . ETOH abuse     Past Surgical History  Procedure Laterality Date  . Radioactive prostate seed implants  03-31-2009    PROSTATE CANCER  . Repair right inguinal hernia  1980  . Appendectomy  1960  . Coronary angioplasty with stent placement  05-15-2010  DR Cherica Heiden    PCI W/ DRUG-ELUTING STENT X1 IN THE FIRST OBTUSE MARGINAL BRANCH OF CIRCUMFLEX ARTERY/ NON-STEMI/  EF 65%  . Mass excision  08/23/2012    Procedure: EXCISION MASS;  Surgeon: Romie Levee, MD;  Location: [facility];  Service: General;  Laterality: Left;  excisional biopsy left thigh mass    Allergies  Allergies  Allergen Reactions  . Oxycodone Other (See Comments)    LETHARGIC  . Oxycontin [Oxycodone Hcl Er] Other (See Comments)    LETHARGIC   HPI  67 y/o male with h/o CAD s/p NSTEMI/PCI/DES of the OM in 05/2010.  He's done well since and is able to complete everyday activities w/o chest pain or dyspnea.  He does not routinely exercise.  About 1 month ago, he began to experience productive cough with yellow sputum.  He's been followed closely by his PCP with symptomatic treatment.  He has not required ABX.  Over the past 5 days, he has continued to cough but sputum has been clear.  His PCP rec taking OTC claritin.  This AM, he awoke @ 5:30 AM  with 6/10 discomfort between his shoulder blades w/o associated Ss.  He recognized Ss as being similar to prior anginal equivalent.  Pain was worse with deep breathing and coughing and some upper body mvmts.  Pain persisted throughout the day but did not significantly limit activities.  He was able to vacuum his home without difficulty.  As discomfort persisted, he decided to come into the ED this evening for further eval.  Here, initial troponin is nl and ECG is w/o acute st/t changes.  He currently reports 2/10 midscapular back pain.  Home Medications  Prior to Admission medications   Medication Sig Start Date End Date Taking? Authorizing Provider  atorvastatin (LIPITOR) 40 MG tablet Take 40 mg by mouth every evening. 06/07/11  Yes Dolores Patty, MD  clopidogrel (PLAVIX) 75 MG tablet Take 1 tablet (75 mg total) by mouth daily. 01/23/13 01/23/14 Yes Bevelyn Buckles Bensimhon, MD  lisinopril (PRINIVIL,ZESTRIL) 10 MG tablet Take 10 mg by mouth daily.    Yes Historical Provider, MD  loratadine (CLARITIN) 10 MG tablet Take 10 mg by mouth daily as needed for allergies.   Yes Historical Provider, MD  metoprolol succinate (TOPROL-XL) 25 MG 24 hr tablet Take 25 mg by mouth every morning. 07/07/11  Yes Lewayne Bunting, MD  Tamsulosin HCl (FLOMAX) 0.4 MG  CAPS Take 0.4 mg by mouth daily.    Yes Historical Provider, MD  vitamin B-12 (CYANOCOBALAMIN) 1000 MCG tablet Take 1,000 mcg by mouth daily.   Yes Historical Provider, MD  nitroGLYCERIN (NITROSTAT) 0.4 MG SL tablet Place 0.4 mg under the tongue every 5 (five) minutes as needed.     Historical Provider, MD   Family History  Family History  Problem Relation Age of Onset  . Heart attack Mother     massive  . Heart disease Mother   . Heart failure Father 10    chf  . Heart disease Father   . Cancer Brother    Social History  History   Social History  . Marital Status: Divorced    Spouse Name: N/A    Number of Children: 0  . Years of Education: N/A     Occupational History  . 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     Social History Main Topics  . Smoking status: Former Smoker -- 2.00 packs/day for 30 years    Types: Cigarettes    Quit date: 05/14/2010  . Smokeless tobacco: Never Used  . Alcohol Use: 8.4 oz/week    14 Cans of beer per week  . Drug Use: No  . Sexual Activity: Not on file   Other Topics Concern  . Not on file   Social History Narrative   Lives in [place].  Retired from [employer].    Review of Systems General:  No chills, fever, night sweats or weight changes.  Cardiovascular:  +++ mid-scapular back pain, no c/p, dyspnea on exertion, edema, orthopnea, palpitations, paroxysmal nocturnal dyspnea. Dermatological: No rash, lesions/masses Respiratory: +++ productive cough as outlined above.  No dyspnea Urologic: No hematuria, dysuria Abdominal:   No nausea, vomiting, diarrhea, bright red blood per rectum, melena, or hematemesis Neurologic:  No visual changes, wkns, changes in mental status. All other systems reviewed and are otherwise negative except as noted above.  Physical Exam  Blood pressure 111/57, pulse 65, temperature 97.5 F (36.4 C), temperature source Oral, resp. rate 21, height 5\' 8"  (1.727 m), weight 228 lb (103.42 kg), SpO2 97.00%.  General: Pleasant, NAD Psych: Normal affect. Neuro: Alert and oriented X 3. Moves all extremities spontaneously. HEENT: Normal  Neck: Supple without bruits or JVD. Lungs:  Resp regular and unlabored, CTA. Heart: RRR no s3, s4, or murmurs. Abdomen: Soft, non-tender, non-distended, BS + x 4.  Extremities: No clubbing, cyanosis or edema. DP/PT/Radials 2+ and equal bilaterally. MSK:  He is mildly tender with palpation over his mid-scapular area.  Labs  poc trop i: 0.00  No results found for this basename: CKTOTAL, CKMB, TROPONINI,  in the last 72 hours Lab Results  Component Value Date   WBC 6.6 08/02/2013   HGB 12.9*  08/02/2013   HCT 37.1* 08/02/2013   MCV 95.4 08/02/2013   PLT 291 08/02/2013     Recent Labs Lab 08/02/13 1530  NA 139  K 3.9  CL 101  CO2 28  BUN 11  CREATININE 0.80  CALCIUM 9.2  GLUCOSE 92   Lab Results  Component Value Date   CHOL 147 06/07/2012   HDL 73.70 06/07/2012   LDLCALC 60 06/07/2012   TRIG 69.0 06/07/2012   Radiology/Studies  Dg Chest Port 1 View  08/02/2013   CLINICAL DATA:  Patient reports having bilateral posterior shoulder pain this morning with some shortness of breath.  EXAM: PORTABLE CHEST - 1  VIEW  COMPARISON:  05/14/2010  FINDINGS: The heart size and mediastinal contours are within normal limits. Both lungs are clear. The visualized skeletal structures are unremarkable.  IMPRESSION: No active disease.   Electronically Signed   By: Amie Portland   On: 08/02/2013 15:39   ECG  Rsr, 79, no acute st/t changes.  ASSESSMENT AND PLAN  1.  Mid-scapular back pain:  Reproducible with palpation, deep breathing, coughing, and upper body mvmts.  Similar to prior angina with the exception of reproducibility.  Despite prolonged Ss, initial troponin and ecg are normal.  We will check 1 more troponin and if it is nl, he's safe to be d/c'd from the ED.  Cont home meds @ d/c.  F/U with Dr. Shirlee Latch in a few wks.  2.  HTN:  Stable.  3.  HL:  Cont statin.  4.  ETOH Abuse:  Cessation advised.  Signed, Nicolasa Ducking, NP 08/02/2013, 6:09 PM As above, taking seen and examined. Briefly he is a 67 year old male with past medical history of coronary artery disease, hypertension, hyperlipidemia, prostate cancer who I am asked to evaluate for chest and back pain. The patient has had previous PCI of his first obtuse marginal in July 2011 in the setting of a non-ST elevation myocardial infarction. His ejection fraction was normal and there was no other obstructive disease noted. He typically does not have dyspnea on exertion, orthopnea, PND, pedal edema, syncope or exertional chest pain.  At 5:30 this morning he awoke with mid scapular back pain. It was described as a throbbing pain. The pain did not radiate and there were no associated symptoms. It was somewhat different than his previous infarct pain. It increased with coughing and certain movements. It persisted all day without completely resolving. He therefore presented to the emergency room. Pain is reproduced with palpation of back. Electrocardiogram shows sinus rhythm with no significant ST changes. Initial troponin is negative. Symptoms are most likely musculoskeletal. I would expect if this was ischemia mediated that we would see an elevation in enzymes as he has had continuous pain for 12 hours. I will check one additional set of enzymes and if negative he can be discharged. He should followup with Dr. Shirlee Latch. I would give Motrin to see if this helps with his pain. Continue preadmission medications.  Olga Millers

## 2013-08-08 DIAGNOSIS — Z23 Encounter for immunization: Secondary | ICD-10-CM | POA: Diagnosis not present

## 2013-08-21 ENCOUNTER — Encounter: Payer: Self-pay | Admitting: Cardiology

## 2013-08-21 ENCOUNTER — Ambulatory Visit (INDEPENDENT_AMBULATORY_CARE_PROVIDER_SITE_OTHER): Payer: Medicare Other | Admitting: Cardiology

## 2013-08-21 VITALS — BP 118/70 | HR 81 | Ht 68.0 in | Wt 232.6 lb

## 2013-08-21 DIAGNOSIS — E785 Hyperlipidemia, unspecified: Secondary | ICD-10-CM

## 2013-08-21 DIAGNOSIS — I2510 Atherosclerotic heart disease of native coronary artery without angina pectoris: Secondary | ICD-10-CM

## 2013-08-21 DIAGNOSIS — I10 Essential (primary) hypertension: Secondary | ICD-10-CM | POA: Diagnosis not present

## 2013-08-21 NOTE — Progress Notes (Signed)
Patient ID: Aaron King, male   DOB: [birth year], 67 y.o.   MRN: [id number] PCP: Dr. Nathanial Rancher  67 yo with history of CAD s/p NSTEMI in 7/11 presents for cardiology followup.  Patient had PCI with DES to OM at the time of his MI.  He was admitted in 9/14 with mid-scapular upper back pain.  He had similar pain with his MI.  The pain was worse with cough and with certain arm movements.  It was reproduced by palpation.  It lasted for about a day total.  Cardiac enzymes remained negative.  The patient was thought to have noncardiac chest pain and was sent home.   Since 9/14, patient has had no chest or upper back pain.  He is reasonably active with no exertional symptoms.  He does have some hip and back pain.  His weight continues to rise.     ECG: NSR, normal  Labs (7/13): LDL 60, HDL 74 Labs (9/14): K 3.9, creatinine 0.8, BNP 124  PMH: 1. Prostate cancer: s/p XRT and seed implantation in 5/10.   2. HTN 3. CAD: NSTEMI 7/11 with DES to OM.   4. Obesity 5. Hyperlipidemia  SH: Quit smoking after MI in 2011.  Retired IT sales professional.  Lives alone in [place].   FH: Father with CHF, CAD.  ROS: All systems reviewed and negative except as per HPI.   Current Outpatient Prescriptions  Medication Sig Dispense Refill  . atorvastatin (LIPITOR) 40 MG tablet Take 40 mg by mouth every evening.      . clopidogrel (PLAVIX) 75 MG tablet Take 1 tablet (75 mg total) by mouth daily.  90 tablet  3  . lisinopril (PRINIVIL,ZESTRIL) 10 MG tablet Take 10 mg by mouth daily.       Marland Kitchen loratadine (CLARITIN) 10 MG tablet Take 10 mg by mouth daily as needed for allergies.      . metoprolol succinate (TOPROL-XL) 25 MG 24 hr tablet Take 25 mg by mouth every morning.      . nitroGLYCERIN (NITROSTAT) 0.4 MG SL tablet Place 0.4 mg under the tongue every 5 (five) minutes as needed.       . Tamsulosin HCl (FLOMAX) 0.4 MG CAPS Take 0.4 mg by mouth daily.       . vitamin B-12 (CYANOCOBALAMIN) 1000 MCG tablet Take 1,000 mcg by mouth  daily.       No current facility-administered medications for this visit.    BP 118/70  Pulse 81  Ht 5\' 8"  (1.727 m)  Wt 105.507 kg (232 lb 9.6 oz)  BMI 35.38 kg/m2 General: NAD, overweight. Neck: No JVD, no thyromegaly or thyroid nodule.  Lungs: Clear to auscultation bilaterally with normal respiratory effort. CV: Nondisplaced PMI.  Heart regular S1/S2, no S3/S4, no murmur.  Trace ankle edema.  No carotid bruit.  Normal pedal pulses.  Abdomen: Soft, nontender, no hepatosplenomegaly, no distention.  Neurologic: Alert and oriented x 3.  Psych: Normal affect. Extremities: No clubbing or cyanosis.   Assessment/Plan: 1. CAD: Most recent episode was probably noncardiac.  He has had no chest or upper back pain since that time.  I do not think that he needs to have a stress test at this time.  Continue Plavix (has not been able to tolerate ASA), statin, ACEI, beta blocker.  2. Hyperlipidemia: We will call PCP to get a copy of recent lipids.  3. Obesity: I encouraged the patient to exercise more and to watch his diet.   Marca Ancona 08/21/2013

## 2013-08-21 NOTE — Patient Instructions (Signed)
Your physician wants you to follow-up in: 1 year with Dr McLean. (October 2015). You will receive a reminder letter in the mail two months in advance. If you don't receive a letter, please call our office to schedule the follow-up appointment.  

## 2013-10-04 IMAGING — CR DG CHEST 1V PORT
2 series · 2 of 2 positions shown · non-contrast
Comparison: 05/14/2010

CLINICAL DATA: Patient reports having bilateral posterior shoulder
pain this morning with some shortness of breath.

EXAM:
PORTABLE CHEST - 1 VIEW

[AP (1 of 2)]
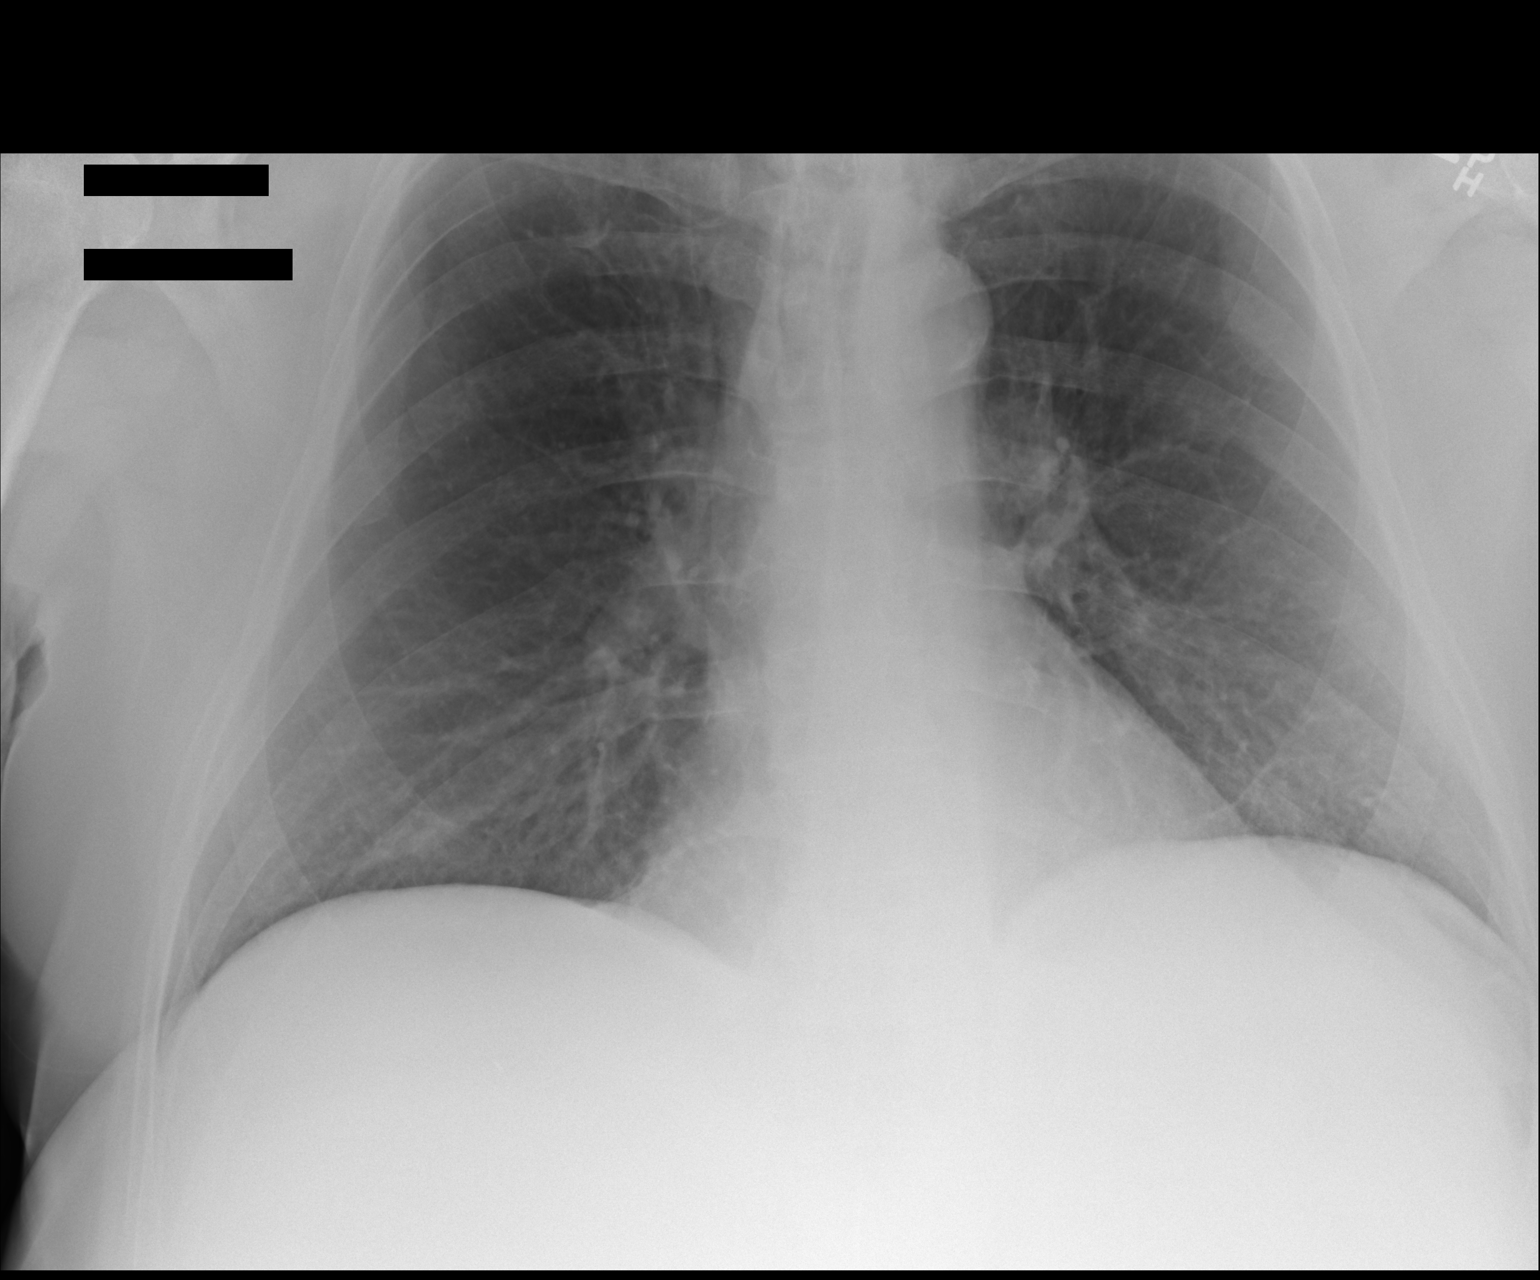

[AP (2 of 2)]
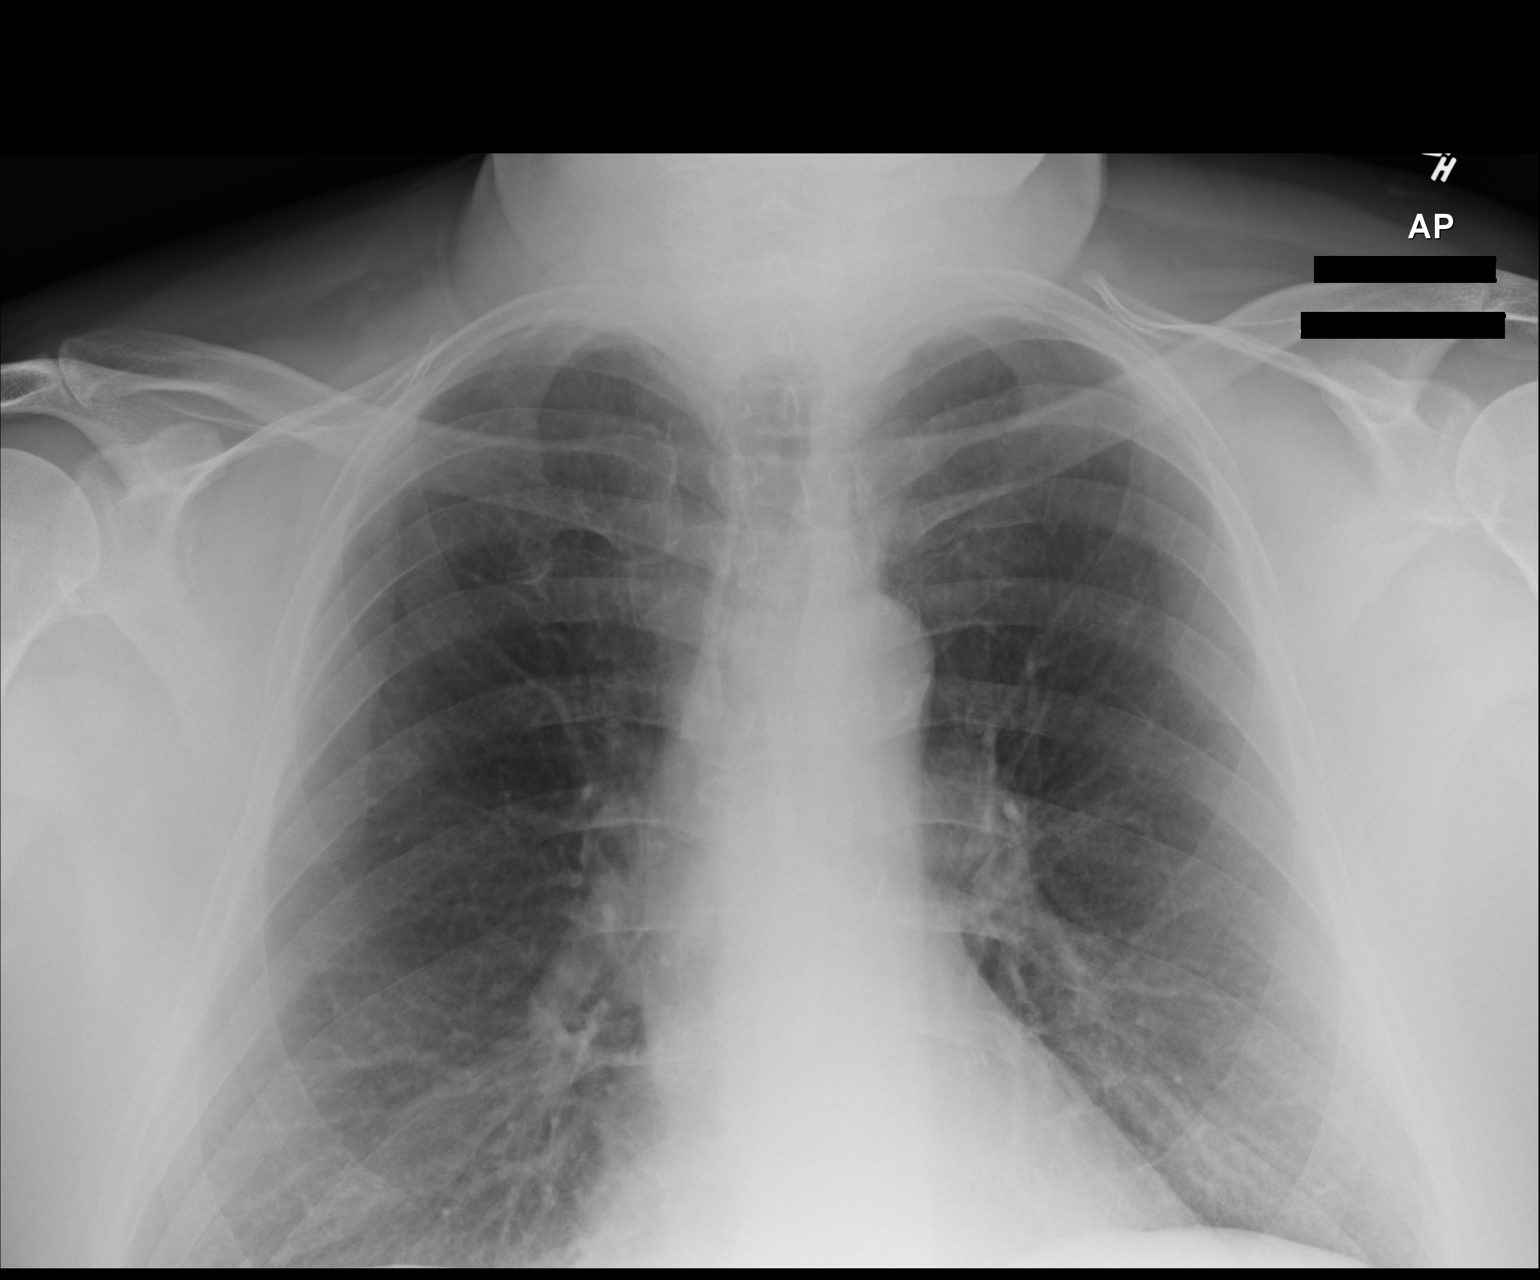

[2 of 2 positions shown; findings below may reference images not displayed]

FINDINGS: The heart size and mediastinal contours are within normal limits.
Both lungs are clear. The visualized skeletal structures are
unremarkable.
IMPRESSION: No active disease.

## 2013-11-06 DIAGNOSIS — C61 Malignant neoplasm of prostate: Secondary | ICD-10-CM | POA: Diagnosis not present

## 2013-11-30 ENCOUNTER — Other Ambulatory Visit (HOSPITAL_COMMUNITY): Payer: Self-pay

## 2013-11-30 MED ORDER — CLOPIDOGREL BISULFATE 75 MG PO TABS: 75.0000 mg | ORAL_TABLET | Freq: Every day | ORAL | Status: DC

## 2014-01-30 DIAGNOSIS — J449 Chronic obstructive pulmonary disease, unspecified: Secondary | ICD-10-CM | POA: Diagnosis not present

## 2014-01-30 DIAGNOSIS — Z1331 Encounter for screening for depression: Secondary | ICD-10-CM | POA: Diagnosis not present

## 2014-01-30 DIAGNOSIS — I10 Essential (primary) hypertension: Secondary | ICD-10-CM | POA: Diagnosis not present

## 2014-01-30 DIAGNOSIS — Z79899 Other long term (current) drug therapy: Secondary | ICD-10-CM | POA: Diagnosis not present

## 2014-01-30 DIAGNOSIS — E7800 Pure hypercholesterolemia, unspecified: Secondary | ICD-10-CM | POA: Diagnosis not present

## 2014-01-30 DIAGNOSIS — L989 Disorder of the skin and subcutaneous tissue, unspecified: Secondary | ICD-10-CM | POA: Diagnosis not present

## 2014-01-30 DIAGNOSIS — Z9181 History of falling: Secondary | ICD-10-CM | POA: Diagnosis not present

## 2014-01-31 DIAGNOSIS — L255 Unspecified contact dermatitis due to plants, except food: Secondary | ICD-10-CM | POA: Diagnosis not present

## 2014-02-06 DIAGNOSIS — D485 Neoplasm of uncertain behavior of skin: Secondary | ICD-10-CM | POA: Diagnosis not present

## 2014-05-02 DIAGNOSIS — C61 Malignant neoplasm of prostate: Secondary | ICD-10-CM | POA: Diagnosis not present

## 2014-08-07 DIAGNOSIS — Z79899 Other long term (current) drug therapy: Secondary | ICD-10-CM | POA: Diagnosis not present

## 2014-08-07 DIAGNOSIS — E7800 Pure hypercholesterolemia, unspecified: Secondary | ICD-10-CM | POA: Diagnosis not present

## 2014-08-07 DIAGNOSIS — J449 Chronic obstructive pulmonary disease, unspecified: Secondary | ICD-10-CM | POA: Diagnosis not present

## 2014-08-07 DIAGNOSIS — I10 Essential (primary) hypertension: Secondary | ICD-10-CM | POA: Diagnosis not present

## 2014-08-07 DIAGNOSIS — Z23 Encounter for immunization: Secondary | ICD-10-CM | POA: Diagnosis not present

## 2014-08-16 ENCOUNTER — Encounter: Payer: Self-pay | Admitting: *Deleted

## 2014-08-22 ENCOUNTER — Ambulatory Visit (INDEPENDENT_AMBULATORY_CARE_PROVIDER_SITE_OTHER): Payer: Medicare Other | Admitting: Cardiology

## 2014-08-22 ENCOUNTER — Encounter: Payer: Self-pay | Admitting: Cardiology

## 2014-08-22 VITALS — BP 110/70 | HR 57 | Ht 68.0 in

## 2014-08-22 DIAGNOSIS — E785 Hyperlipidemia, unspecified: Secondary | ICD-10-CM | POA: Diagnosis not present

## 2014-08-22 DIAGNOSIS — I2510 Atherosclerotic heart disease of native coronary artery without angina pectoris: Secondary | ICD-10-CM | POA: Diagnosis not present

## 2014-08-22 LAB — LIPID PANEL
CHOLESTEROL: 152 mg/dL (ref 0–200)
HDL: 45.6 mg/dL (ref >39.00)
LDL Cholesterol: 88 mg/dL (ref 0–99)
NonHDL: 106.4
Total CHOL/HDL Ratio: 3
Triglycerides: 91 mg/dL (ref 0.0–149.0)
VLDL: 18.2 mg/dL (ref 0.0–40.0)

## 2014-08-22 LAB — BASIC METABOLIC PANEL
BUN: 9 mg/dL (ref 6–23)
CHLORIDE: 103 meq/L (ref 96–112)
CO2: 29 meq/L (ref 19–32)
CREATININE: 0.9 mg/dL (ref 0.4–1.5)
Calcium: 9.1 mg/dL (ref 8.4–10.5)
GFR: 85.03 mL/min (ref >60.00)
Glucose, Bld: 90 mg/dL (ref 70–99)
Potassium: 4.3 mEq/L (ref 3.5–5.1)
Sodium: 138 mEq/L (ref 135–145)

## 2014-08-22 NOTE — Progress Notes (Signed)
Patient ID: Aaron King, male   DOB: November 20, 1945, 68 y.o.   MRN: [id number] PCP: Dr. Lisbeth Ply  68 yo with history of CAD s/p NSTEMI in 7/11 presents for cardiology followup.  Patient had PCI with DES to OM at the time of his MI.  He was admitted in 9/14 with mid-scapular upper back pain.  He had similar pain with his MI.  The pain was worse with cough and with certain arm movements.  It was reproduced by palpation.  It lasted for about a day total.  Cardiac enzymes remained negative.  The patient was thought to have noncardiac chest pain and was sent home.   Since last appointment, he has been doing well.  No chest pain or NTG use.  No exertional dyspnea with normal activities.  Mild dyspnea carrying a heavy load.  Active with [facility] [employer].   ECG: NSR, normal  Labs (7/13): LDL 60, HDL 74 Labs (9/14): K 3.9, creatinine 0.8, BNP 124  PMH: 1. Prostate cancer: s/p XRT and seed implantation in 5/10.   2. HTN 3. CAD: NSTEMI 7/11 with DES to OM.   4. Obesity 5. Hyperlipidemia  SH: Quit smoking after MI in 2011.  Retired Airline pilot.  Lives in [place].   FH: Father with CHF, CAD.  ROS: All systems reviewed and negative except as per HPI.   Current Outpatient Prescriptions  Medication Sig Dispense Refill  . atorvastatin (LIPITOR) 40 MG tablet Take 40 mg by mouth every evening.      . clopidogrel (PLAVIX) 75 MG tablet Take 1 tablet (75 mg total) by mouth daily.  90 tablet  3  . lisinopril (PRINIVIL,ZESTRIL) 10 MG tablet Take 10 mg by mouth daily.       . metoprolol succinate (TOPROL-XL) 25 MG 24 hr tablet Take 25 mg by mouth every morning.      . nitroGLYCERIN (NITROSTAT) 0.4 MG SL tablet Place 0.4 mg under the tongue every 5 (five) minutes as needed.       . Tamsulosin HCl (FLOMAX) 0.4 MG CAPS Take 0.4 mg by mouth daily.       Marland Kitchen loratadine (CLARITIN) 10 MG tablet Take 10 mg by mouth daily as needed for allergies.      . vitamin B-12 (CYANOCOBALAMIN) 1000 MCG tablet Take 1,000 mcg by  mouth daily.       No current facility-administered medications for this visit.    BP 110/70  Pulse 57  Ht 5\' 8"  (1.727 m) General: NAD. Neck: No JVD, no thyromegaly or thyroid nodule.  Lungs: Clear to auscultation bilaterally with normal respiratory effort. CV: Nondisplaced PMI.  Heart regular S1/S2, no S3/S4, no murmur.  Trace ankle edema.  No carotid bruit.  Normal pedal pulses.  Abdomen: Soft, nontender, no hepatosplenomegaly, no distention.  Neurologic: Alert and oriented x 3.  Psych: Normal affect. Extremities: No clubbing or cyanosis.   Assessment/Plan: 1. CAD: No ischemic symptoms, normal ECG.  Doing well overall.  Continue Plavix (has not been able to tolerate ASA), statin, ACEI, beta blocker.  2. Hyperlipidemia: Check lipids today.    Followup in 1 year.   [name] 08/22/2014

## 2014-08-22 NOTE — Patient Instructions (Signed)
Your physician recommends that you have a FASTING lipid profile /BMET today.  Your physician wants you to follow-up in: 1 year with Dr Aundra Dubin. (October 2016).You will receive a reminder letter in the mail two months in advance. If you don't receive a letter, please call our office to schedule the follow-up appointment.

## 2014-08-22 NOTE — Addendum Note (Signed)
Addended by: Katrine Coho on: 08/22/2014 12:21 PM   Modules accepted: Orders

## 2014-08-23 NOTE — Telephone Encounter (Signed)
Nothing was typed

## 2014-08-26 NOTE — Addendum Note (Signed)
Addended by: Katrine Coho on: 08/26/2014 08:16 AM   Modules accepted: Orders

## 2015-02-11 DIAGNOSIS — Z79899 Other long term (current) drug therapy: Secondary | ICD-10-CM | POA: Diagnosis not present

## 2015-02-11 DIAGNOSIS — Z6836 Body mass index (BMI) 36.0-36.9, adult: Secondary | ICD-10-CM | POA: Diagnosis not present

## 2015-02-11 DIAGNOSIS — J449 Chronic obstructive pulmonary disease, unspecified: Secondary | ICD-10-CM | POA: Diagnosis not present

## 2015-02-11 DIAGNOSIS — I10 Essential (primary) hypertension: Secondary | ICD-10-CM | POA: Diagnosis not present

## 2015-02-11 DIAGNOSIS — L299 Pruritus, unspecified: Secondary | ICD-10-CM | POA: Diagnosis not present

## 2015-02-11 DIAGNOSIS — Z1389 Encounter for screening for other disorder: Secondary | ICD-10-CM | POA: Diagnosis not present

## 2015-02-11 DIAGNOSIS — E780 Pure hypercholesterolemia: Secondary | ICD-10-CM | POA: Diagnosis not present

## 2015-02-11 DIAGNOSIS — Z9181 History of falling: Secondary | ICD-10-CM | POA: Diagnosis not present

## 2015-02-21 ENCOUNTER — Other Ambulatory Visit (HOSPITAL_COMMUNITY): Payer: Self-pay | Admitting: Internal Medicine

## 2015-06-05 DIAGNOSIS — Z8546 Personal history of malignant neoplasm of prostate: Secondary | ICD-10-CM | POA: Diagnosis not present

## 2015-08-13 DIAGNOSIS — E7800 Pure hypercholesterolemia, unspecified: Secondary | ICD-10-CM | POA: Diagnosis not present

## 2015-08-13 DIAGNOSIS — I10 Essential (primary) hypertension: Secondary | ICD-10-CM | POA: Diagnosis not present

## 2015-08-13 DIAGNOSIS — Z79899 Other long term (current) drug therapy: Secondary | ICD-10-CM | POA: Diagnosis not present

## 2015-08-13 DIAGNOSIS — Z1389 Encounter for screening for other disorder: Secondary | ICD-10-CM | POA: Diagnosis not present

## 2015-08-13 DIAGNOSIS — Z23 Encounter for immunization: Secondary | ICD-10-CM | POA: Diagnosis not present

## 2015-08-13 DIAGNOSIS — Z87891 Personal history of nicotine dependence: Secondary | ICD-10-CM | POA: Diagnosis not present

## 2015-08-13 DIAGNOSIS — I2510 Atherosclerotic heart disease of native coronary artery without angina pectoris: Secondary | ICD-10-CM | POA: Diagnosis not present

## 2015-08-13 DIAGNOSIS — E781 Pure hyperglyceridemia: Secondary | ICD-10-CM | POA: Diagnosis not present

## 2015-08-13 DIAGNOSIS — Z6835 Body mass index (BMI) 35.0-35.9, adult: Secondary | ICD-10-CM | POA: Diagnosis not present

## 2015-08-13 DIAGNOSIS — Z9181 History of falling: Secondary | ICD-10-CM | POA: Diagnosis not present

## 2015-08-13 DIAGNOSIS — D492 Neoplasm of unspecified behavior of bone, soft tissue, and skin: Secondary | ICD-10-CM | POA: Diagnosis not present

## 2015-08-20 ENCOUNTER — Other Ambulatory Visit (HOSPITAL_COMMUNITY): Payer: Self-pay | Admitting: Family Medicine

## 2015-08-20 DIAGNOSIS — Z87891 Personal history of nicotine dependence: Secondary | ICD-10-CM

## 2015-08-20 DIAGNOSIS — Z139 Encounter for screening, unspecified: Secondary | ICD-10-CM

## 2015-08-20 DIAGNOSIS — D485 Neoplasm of uncertain behavior of skin: Secondary | ICD-10-CM | POA: Diagnosis not present

## 2015-08-28 ENCOUNTER — Ambulatory Visit (HOSPITAL_COMMUNITY)
Admission: RE | Admit: 2015-08-28 | Discharge: 2015-08-28 | Disposition: A | Discharge status: Home / Self Care | Payer: Medicare Other | Source: Ambulatory Visit | Attending: Family Medicine | Admitting: Family Medicine

## 2015-08-28 DIAGNOSIS — F17210 Nicotine dependence, cigarettes, uncomplicated: Secondary | ICD-10-CM | POA: Diagnosis not present

## 2015-08-28 DIAGNOSIS — I700 Atherosclerosis of aorta: Secondary | ICD-10-CM | POA: Diagnosis not present

## 2015-08-28 DIAGNOSIS — Z87891 Personal history of nicotine dependence: Secondary | ICD-10-CM

## 2015-08-28 DIAGNOSIS — I2510 Atherosclerotic heart disease of native coronary artery without angina pectoris: Secondary | ICD-10-CM | POA: Diagnosis not present

## 2015-08-28 DIAGNOSIS — R918 Other nonspecific abnormal finding of lung field: Secondary | ICD-10-CM | POA: Diagnosis not present

## 2015-08-28 DIAGNOSIS — J432 Centrilobular emphysema: Secondary | ICD-10-CM | POA: Diagnosis not present

## 2015-08-28 DIAGNOSIS — Z122 Encounter for screening for malignant neoplasm of respiratory organs: Secondary | ICD-10-CM | POA: Insufficient documentation

## 2015-08-28 DIAGNOSIS — K760 Fatty (change of) liver, not elsewhere classified: Secondary | ICD-10-CM | POA: Insufficient documentation

## 2015-12-11 DIAGNOSIS — E669 Obesity, unspecified: Secondary | ICD-10-CM | POA: Diagnosis not present

## 2015-12-11 DIAGNOSIS — M47892 Other spondylosis, cervical region: Secondary | ICD-10-CM | POA: Diagnosis not present

## 2015-12-11 DIAGNOSIS — Z6836 Body mass index (BMI) 36.0-36.9, adult: Secondary | ICD-10-CM | POA: Diagnosis not present

## 2015-12-11 DIAGNOSIS — R202 Paresthesia of skin: Secondary | ICD-10-CM | POA: Diagnosis not present

## 2015-12-11 DIAGNOSIS — M50322 Other cervical disc degeneration at C5-C6 level: Secondary | ICD-10-CM | POA: Diagnosis not present

## 2015-12-11 DIAGNOSIS — M5030 Other cervical disc degeneration, unspecified cervical region: Secondary | ICD-10-CM | POA: Diagnosis not present

## 2016-01-08 DIAGNOSIS — E669 Obesity, unspecified: Secondary | ICD-10-CM | POA: Diagnosis not present

## 2016-01-08 DIAGNOSIS — Z6835 Body mass index (BMI) 35.0-35.9, adult: Secondary | ICD-10-CM | POA: Diagnosis not present

## 2016-01-08 DIAGNOSIS — L821 Other seborrheic keratosis: Secondary | ICD-10-CM | POA: Diagnosis not present

## 2016-01-08 DIAGNOSIS — D485 Neoplasm of uncertain behavior of skin: Secondary | ICD-10-CM | POA: Diagnosis not present

## 2016-01-12 DIAGNOSIS — C44309 Unspecified malignant neoplasm of skin of other parts of face: Secondary | ICD-10-CM | POA: Diagnosis not present

## 2016-01-12 DIAGNOSIS — D485 Neoplasm of uncertain behavior of skin: Secondary | ICD-10-CM | POA: Diagnosis not present

## 2016-01-12 DIAGNOSIS — C44329 Squamous cell carcinoma of skin of other parts of face: Secondary | ICD-10-CM | POA: Diagnosis not present

## 2016-01-27 ENCOUNTER — Other Ambulatory Visit: Payer: Self-pay | Admitting: Internal Medicine

## 2016-03-03 DIAGNOSIS — D485 Neoplasm of uncertain behavior of skin: Secondary | ICD-10-CM | POA: Diagnosis not present

## 2016-03-03 DIAGNOSIS — G4700 Insomnia, unspecified: Secondary | ICD-10-CM | POA: Diagnosis not present

## 2016-03-03 DIAGNOSIS — Z79899 Other long term (current) drug therapy: Secondary | ICD-10-CM | POA: Diagnosis not present

## 2016-03-03 DIAGNOSIS — J449 Chronic obstructive pulmonary disease, unspecified: Secondary | ICD-10-CM | POA: Diagnosis not present

## 2016-03-03 DIAGNOSIS — I2510 Atherosclerotic heart disease of native coronary artery without angina pectoris: Secondary | ICD-10-CM | POA: Diagnosis not present

## 2016-03-03 DIAGNOSIS — Z6836 Body mass index (BMI) 36.0-36.9, adult: Secondary | ICD-10-CM | POA: Diagnosis not present

## 2016-03-03 DIAGNOSIS — I10 Essential (primary) hypertension: Secondary | ICD-10-CM | POA: Diagnosis not present

## 2016-03-03 DIAGNOSIS — E7800 Pure hypercholesterolemia, unspecified: Secondary | ICD-10-CM | POA: Diagnosis not present

## 2016-03-03 DIAGNOSIS — L729 Follicular cyst of the skin and subcutaneous tissue, unspecified: Secondary | ICD-10-CM | POA: Diagnosis not present

## 2016-04-14 DIAGNOSIS — L723 Sebaceous cyst: Secondary | ICD-10-CM | POA: Diagnosis not present

## 2016-04-14 DIAGNOSIS — Z6835 Body mass index (BMI) 35.0-35.9, adult: Secondary | ICD-10-CM | POA: Diagnosis not present

## 2016-04-14 DIAGNOSIS — Z1211 Encounter for screening for malignant neoplasm of colon: Secondary | ICD-10-CM | POA: Diagnosis not present

## 2016-05-06 ENCOUNTER — Encounter: Payer: Self-pay | Admitting: *Deleted

## 2016-05-13 DIAGNOSIS — K635 Polyp of colon: Secondary | ICD-10-CM | POA: Diagnosis not present

## 2016-05-13 DIAGNOSIS — D120 Benign neoplasm of cecum: Secondary | ICD-10-CM | POA: Diagnosis not present

## 2016-05-13 DIAGNOSIS — Z800 Family history of malignant neoplasm of digestive organs: Secondary | ICD-10-CM | POA: Diagnosis not present

## 2016-05-13 DIAGNOSIS — K5730 Diverticulosis of large intestine without perforation or abscess without bleeding: Secondary | ICD-10-CM | POA: Diagnosis not present

## 2016-05-13 DIAGNOSIS — D124 Benign neoplasm of descending colon: Secondary | ICD-10-CM | POA: Diagnosis not present

## 2016-05-24 ENCOUNTER — Other Ambulatory Visit: Payer: Self-pay | Admitting: Internal Medicine

## 2016-05-26 ENCOUNTER — Encounter: Payer: Self-pay | Admitting: Cardiology

## 2016-05-26 ENCOUNTER — Ambulatory Visit (INDEPENDENT_AMBULATORY_CARE_PROVIDER_SITE_OTHER): Payer: Medicare Other | Admitting: Cardiology

## 2016-05-26 VITALS — BP 118/62 | HR 64 | Ht 68.0 in | Wt 218.0 lb

## 2016-05-26 DIAGNOSIS — I2510 Atherosclerotic heart disease of native coronary artery without angina pectoris: Secondary | ICD-10-CM | POA: Diagnosis not present

## 2016-05-26 DIAGNOSIS — I10 Essential (primary) hypertension: Secondary | ICD-10-CM | POA: Diagnosis not present

## 2016-05-26 LAB — LIPID PANEL
CHOL/HDL RATIO: 1.8 ratio (ref <=5.0)
CHOLESTEROL: 132 mg/dL (ref 125–200)
HDL: 72 mg/dL (ref >=40)
LDL Cholesterol: 36 mg/dL (ref <130)
TRIGLYCERIDES: 121 mg/dL (ref <150)
VLDL: 24 mg/dL (ref <30)

## 2016-05-26 LAB — BASIC METABOLIC PANEL
BUN: 17 mg/dL (ref 7–25)
CHLORIDE: 101 mmol/L (ref 98–110)
CO2: 23 mmol/L (ref 20–31)
Calcium: 8.8 mg/dL (ref 8.6–10.3)
Creat: 0.89 mg/dL (ref 0.70–1.25)
Glucose, Bld: 90 mg/dL (ref 65–99)
POTASSIUM: 3.7 mmol/L (ref 3.5–5.3)
SODIUM: 139 mmol/L (ref 135–146)

## 2016-05-26 NOTE — Patient Instructions (Signed)
Medication Instructions:  Your physician recommends that you continue on your current medications as directed. Please refer to the Current Medication list given to you today.   Labwork: Lipid profile/BMET today  Testing/Procedures: None   Follow-Up: Your physician wants you to follow-up in: 1 year with Dr Aundra Dubin. (July 2018)      If you need a refill on your cardiac medications before your next appointment, please call your pharmacy.

## 2016-05-28 NOTE — Progress Notes (Signed)
Patient ID: Aaron King, male   DOB: 1946/02/07, 70 y.o.   MRN: [id number] PCP: Dr. Lisbeth Ply  70 yo with history of CAD s/p NSTEMI in 7/11 presents for cardiology followup.  Patient had PCI with DES to OM at the time of his MI.  He was admitted in 9/14 with mid-scapular upper back pain.  He had similar pain with his MI.  The pain was worse with cough and with certain arm movements.  It was reproduced by palpation.  It lasted for about a day total.  Cardiac enzymes remained negative.  The patient was thought to have noncardiac chest pain and was sent home.   Since last appointment, he has been doing well.  No chest pain or NTG use.  No exertional dyspnea with normal activities.  Mild dyspnea carrying a heavy load or when exerting himself in the extreme heat.  Active with [facility] [employer].   ECG: NSR, normal  Labs (7/13): LDL 60, HDL 74 Labs (9/14): K 3.9, creatinine 0.8, BNP 124 Labs (10/15): LDL 88  PMH: 1. Prostate cancer: s/p XRT and seed implantation in 5/10.   2. HTN 3. CAD: NSTEMI 7/11 with DES to OM.   4. Obesity 5. Hyperlipidemia 6. Colon polyps.   SH: Quit smoking after MI in 2011.  Retired Airline pilot.  Lives in [place].   FH: Father with CHF, CAD.  ROS: All systems reviewed and negative except as per HPI.   Current Outpatient Prescriptions  Medication Sig Dispense Refill  . albuterol (PROVENTIL HFA;VENTOLIN HFA) 108 (90 Base) MCG/ACT inhaler Inhale 2 puffs into the lungs every 4 (four) hours as needed for wheezing or shortness of breath.    Marland Kitchen atorvastatin (LIPITOR) 40 MG tablet Take 40 mg by mouth every evening.    . clopidogrel (PLAVIX) 75 MG tablet TAKE ONE TABLET BY MOUTH ONCE DAILY 90 tablet 3  . lisinopril (PRINIVIL,ZESTRIL) 10 MG tablet Take 10 mg by mouth daily.     . metoprolol succinate (TOPROL-XL) 25 MG 24 hr tablet Take 25 mg by mouth every morning.    . nitroGLYCERIN (NITROSTAT) 0.4 MG SL tablet Place 0.4 mg under the tongue every 5 (five) minutes as  needed.     . Tamsulosin HCl (FLOMAX) 0.4 MG CAPS Take 0.4 mg by mouth daily.     . traMADol (ULTRAM) 50 MG tablet Take by mouth every 12 (twelve) hours as needed for moderate pain or severe pain.    . vitamin B-12 (CYANOCOBALAMIN) 1000 MCG tablet Take 1,000 mcg by mouth daily.    . vitamin C (ASCORBIC ACID) 500 MG tablet Take 500 mg by mouth daily.    Marland Kitchen zolpidem (AMBIEN) 5 MG tablet Take 5 mg by mouth at bedtime as needed for sleep.    Marland Kitchen loratadine (CLARITIN) 10 MG tablet Take 10 mg by mouth daily as needed for allergies. Reported on 05/26/2016     No current facility-administered medications for this visit.    BP 118/62 mmHg  Pulse 64  Ht 5\' 8"  (1.727 m)  Wt 218 lb (98.884 kg)  BMI 33.15 kg/m2 General: NAD. Neck: No JVD, no thyromegaly or thyroid nodule.  Lungs: Clear to auscultation bilaterally with normal respiratory effort. CV: Nondisplaced PMI.  Heart regular S1/S2, no S3/S4, no murmur.  No edema.  No carotid bruit.  Normal pedal pulses.  Abdomen: Soft, nontender, no hepatosplenomegaly, no distention.  Neurologic: Alert and oriented x 3.  Psych: Normal affect. Extremities: No clubbing or cyanosis.   Assessment/Plan:  1. CAD: No ischemic symptoms, normal ECG.  Doing well overall.  Continue Plavix (has not been able to tolerate ASA), statin, ACEI, beta blocker.  2. Hyperlipidemia: Check lipids today, goal LDL < 70.    Followup in 1 year.   [name] 05/28/2016

## 2016-06-14 DIAGNOSIS — Z8546 Personal history of malignant neoplasm of prostate: Secondary | ICD-10-CM | POA: Diagnosis not present

## 2016-06-14 DIAGNOSIS — N5201 Erectile dysfunction due to arterial insufficiency: Secondary | ICD-10-CM | POA: Diagnosis not present

## 2016-06-14 DIAGNOSIS — C61 Malignant neoplasm of prostate: Secondary | ICD-10-CM | POA: Diagnosis not present

## 2016-09-14 DIAGNOSIS — J449 Chronic obstructive pulmonary disease, unspecified: Secondary | ICD-10-CM | POA: Diagnosis not present

## 2016-09-14 DIAGNOSIS — I10 Essential (primary) hypertension: Secondary | ICD-10-CM | POA: Diagnosis not present

## 2016-09-14 DIAGNOSIS — Z9181 History of falling: Secondary | ICD-10-CM | POA: Diagnosis not present

## 2016-09-14 DIAGNOSIS — Z23 Encounter for immunization: Secondary | ICD-10-CM | POA: Diagnosis not present

## 2016-09-14 DIAGNOSIS — Z87891 Personal history of nicotine dependence: Secondary | ICD-10-CM | POA: Diagnosis not present

## 2016-09-14 DIAGNOSIS — Z1389 Encounter for screening for other disorder: Secondary | ICD-10-CM | POA: Diagnosis not present

## 2016-09-14 DIAGNOSIS — E7800 Pure hypercholesterolemia, unspecified: Secondary | ICD-10-CM | POA: Diagnosis not present

## 2017-03-17 DIAGNOSIS — Z6837 Body mass index (BMI) 37.0-37.9, adult: Secondary | ICD-10-CM | POA: Diagnosis not present

## 2017-03-17 DIAGNOSIS — J449 Chronic obstructive pulmonary disease, unspecified: Secondary | ICD-10-CM | POA: Diagnosis not present

## 2017-03-17 DIAGNOSIS — M25551 Pain in right hip: Secondary | ICD-10-CM | POA: Diagnosis not present

## 2017-03-17 DIAGNOSIS — Z87891 Personal history of nicotine dependence: Secondary | ICD-10-CM | POA: Diagnosis not present

## 2017-03-17 DIAGNOSIS — I10 Essential (primary) hypertension: Secondary | ICD-10-CM | POA: Diagnosis not present

## 2017-03-17 DIAGNOSIS — Z23 Encounter for immunization: Secondary | ICD-10-CM | POA: Diagnosis not present

## 2017-03-17 DIAGNOSIS — R350 Frequency of micturition: Secondary | ICD-10-CM | POA: Diagnosis not present

## 2017-03-17 DIAGNOSIS — Z8546 Personal history of malignant neoplasm of prostate: Secondary | ICD-10-CM | POA: Diagnosis not present

## 2017-03-17 DIAGNOSIS — E669 Obesity, unspecified: Secondary | ICD-10-CM | POA: Diagnosis not present

## 2017-03-17 DIAGNOSIS — E7800 Pure hypercholesterolemia, unspecified: Secondary | ICD-10-CM | POA: Diagnosis not present

## 2017-03-17 DIAGNOSIS — Z79899 Other long term (current) drug therapy: Secondary | ICD-10-CM | POA: Diagnosis not present

## 2017-03-17 DIAGNOSIS — I252 Old myocardial infarction: Secondary | ICD-10-CM | POA: Diagnosis not present

## 2017-03-18 DIAGNOSIS — M25551 Pain in right hip: Secondary | ICD-10-CM | POA: Diagnosis not present

## 2017-03-18 DIAGNOSIS — M1611 Unilateral primary osteoarthritis, right hip: Secondary | ICD-10-CM | POA: Diagnosis not present

## 2017-03-22 DIAGNOSIS — C61 Malignant neoplasm of prostate: Secondary | ICD-10-CM | POA: Diagnosis not present

## 2017-03-22 DIAGNOSIS — R3121 Asymptomatic microscopic hematuria: Secondary | ICD-10-CM | POA: Diagnosis not present

## 2017-03-22 DIAGNOSIS — R300 Dysuria: Secondary | ICD-10-CM | POA: Diagnosis not present

## 2017-03-31 DIAGNOSIS — R3121 Asymptomatic microscopic hematuria: Secondary | ICD-10-CM | POA: Diagnosis not present

## 2017-03-31 DIAGNOSIS — R319 Hematuria, unspecified: Secondary | ICD-10-CM | POA: Diagnosis not present

## 2017-05-30 ENCOUNTER — Other Ambulatory Visit: Payer: Self-pay | Admitting: Cardiology

## 2017-06-13 DIAGNOSIS — E785 Hyperlipidemia, unspecified: Secondary | ICD-10-CM | POA: Diagnosis not present

## 2017-06-13 DIAGNOSIS — Z0000 Encounter for general adult medical examination without abnormal findings: Secondary | ICD-10-CM | POA: Diagnosis not present

## 2017-06-13 DIAGNOSIS — Z125 Encounter for screening for malignant neoplasm of prostate: Secondary | ICD-10-CM | POA: Diagnosis not present

## 2017-06-13 DIAGNOSIS — Z1389 Encounter for screening for other disorder: Secondary | ICD-10-CM | POA: Diagnosis not present

## 2017-06-13 DIAGNOSIS — Z6837 Body mass index (BMI) 37.0-37.9, adult: Secondary | ICD-10-CM | POA: Diagnosis not present

## 2017-06-13 DIAGNOSIS — Z9181 History of falling: Secondary | ICD-10-CM | POA: Diagnosis not present

## 2017-06-13 DIAGNOSIS — Z136 Encounter for screening for cardiovascular disorders: Secondary | ICD-10-CM | POA: Diagnosis not present

## 2017-06-23 ENCOUNTER — Other Ambulatory Visit: Payer: Self-pay | Admitting: Cardiology

## 2017-07-01 ENCOUNTER — Other Ambulatory Visit: Payer: Self-pay | Admitting: Cardiology

## 2017-07-12 ENCOUNTER — Other Ambulatory Visit: Payer: Self-pay | Admitting: Cardiology

## 2017-07-15 ENCOUNTER — Other Ambulatory Visit: Payer: Self-pay | Admitting: *Deleted

## 2017-07-15 ENCOUNTER — Telehealth: Payer: Self-pay | Admitting: Cardiology

## 2017-07-15 MED ORDER — CLOPIDOGREL BISULFATE 75 MG PO TABS: 75.0000 mg | ORAL_TABLET | Freq: Every day | ORAL | 0 refills | Status: DC

## 2017-07-15 NOTE — Telephone Encounter (Signed)
New Message   *STAT* If patient is at the pharmacy, call can be transferred to refill team.   1. Which medications need to be refilled? (please list name of each medication and dose if known) plavix 75mg    2. Which pharmacy/location (including street and city if local pharmacy) is medication to be sent to? [place]  3. Do they need a 30 day or 90 day supply? [place]

## 2017-07-20 ENCOUNTER — Ambulatory Visit (INDEPENDENT_AMBULATORY_CARE_PROVIDER_SITE_OTHER): Payer: Medicare Other | Admitting: Nurse Practitioner

## 2017-07-20 ENCOUNTER — Encounter: Payer: Self-pay | Admitting: Nurse Practitioner

## 2017-07-20 VITALS — BP 132/68 | HR 68 | Ht 68.0 in | Wt 230.1 lb

## 2017-07-20 DIAGNOSIS — E7800 Pure hypercholesterolemia, unspecified: Secondary | ICD-10-CM | POA: Diagnosis not present

## 2017-07-20 DIAGNOSIS — I2510 Atherosclerotic heart disease of native coronary artery without angina pectoris: Secondary | ICD-10-CM

## 2017-07-20 DIAGNOSIS — I10 Essential (primary) hypertension: Secondary | ICD-10-CM

## 2017-07-20 MED ORDER — CLOPIDOGREL BISULFATE 75 MG PO TABS: 75.0000 mg | ORAL_TABLET | Freq: Every day | ORAL | 3 refills | Status: DC

## 2017-07-20 MED ORDER — NITROGLYCERIN 0.4 MG SL SUBL: 0.4000 mg | SUBLINGUAL_TABLET | SUBLINGUAL | 12 refills | Status: DC | PRN

## 2017-07-20 NOTE — Patient Instructions (Addendum)
We will be checking the following labs today - NONE   Medication Instructions:    Continue with your current medicines.  I refilled your Plavix and NTG  Baby aspirin is recommended.      Testing/Procedures To Be Arranged:  N/A  Follow-Up:   See me in one year - sooner if you have issues.     Other Special Instructions:   Mail [place] a copy of your most recent CT - Truitt Merle, NP  Address is [place], [place] [address]    If you need a refill on your cardiac medications before your next appointment, please call your pharmacy.   Call the [place] office at [phone] if you have any questions, problems or concerns.

## 2017-07-20 NOTE — Progress Notes (Signed)
CARDIOLOGY OFFICE NOTE  Date:  07/20/2017    Aaron King Date of Birth: October 22, 1946 Medical Record #[id number]  PCP:  Philmore Pali, NP  Cardiologist:  Annabell Howells   Chief Complaint  Patient presents with  . Coronary Artery Disease    15 month check - seen for Dr. Aundra Dubin    History of Present Illness: Aaron King is a 71 y.o. male who presents today for a 15 month check. Former patient of Dr. Claris Gladden.   He has a history of CAD s/p NSTEMI in 7/11. He had PCI with DES to OM at the time of his MI.  He was admitted in 9/14 with mid-scapular upper back pain.  He had similar pain with his MI.  The pain was worse with cough and with certain arm movements.  It was reproduced by palpation.  It lasted for about a day total.  Cardiac enzymes remained negative.  The patient was thought to have noncardiac chest pain and was sent home. Other issues include obesity, prostate CA, HTN and HLD.   Last seen back in July of 2017 - felt to be doing well.   Comes in today. Here alone. He feels like he is doing well. No chest pain. Breathing is good. Has new PCP now - most recent labs noted. He is here to get his Plavix refilled - was out for about 10 days due to issues with not being able to get from [place]. Also needs NTG refilled. 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 He does not take aspirin - unclear as to me as to why he does not. He is not allergic. Does have some easy bruising. His biggest concern is that he has had a recent CT - done by Dr. Risa Grill - I cannot see this - he will bring [place] a copy.   Past Medical History:  Diagnosis Date  . CAD (coronary artery disease)    a. 05/2010 NSTEMI/Cath/PCI: LM 20, LAD min irregs, LCX min irregs, OM1 99(2.5x12 Promus DES), RCA 30p, EF 65%;  b. effient d/c 2/2 to gum bleeding and epistaxis  . Dyslipidemia   . ETOH abuse   . History of prostate cancer    s/p XRT and seed implantation in May 2010  . Hypertension   . Left Thigh Mass      Past Surgical History:  Procedure Laterality Date  . APPENDECTOMY  1960  . CORONARY ANGIOPLASTY WITH STENT PLACEMENT  05-15-2010  DR CRENSHAW   PCI W/ DRUG-ELUTING STENT X1 IN THE FIRST OBTUSE MARGINAL BRANCH OF CIRCUMFLEX ARTERY/ NON-STEMI/  EF 65%  . MASS EXCISION  08/23/2012   Procedure: EXCISION MASS;  Surgeon: Leighton Ruff, MD;  [facility];  Service: General;  Laterality: Left;  excisional biopsy left thigh mass  . RADIOACTIVE PROSTATE SEED IMPLANTS  03-31-2009   PROSTATE CANCER  . REPAIR RIGHT INGUINAL HERNIA  1980     Medications: Current Meds  Medication Sig  . albuterol (PROVENTIL HFA;VENTOLIN HFA) 108 (90 Base) MCG/ACT inhaler Inhale 2 puffs into the lungs every 4 (four) hours as needed for wheezing or shortness of breath.  Marland Kitchen atorvastatin (LIPITOR) 40 MG tablet Take 40 mg by mouth every evening.  . clopidogrel (PLAVIX) 75 MG tablet Take 1 tablet (75 mg total) by mouth daily.  Marland Kitchen lisinopril (PRINIVIL,ZESTRIL) 10 MG tablet Take 10 mg by mouth daily.   . metoprolol succinate (TOPROL-XL) 25 MG 24 hr tablet Take  25 mg by mouth every morning.  . nitroGLYCERIN (NITROSTAT) 0.4 MG SL tablet Place 1 tablet (0.4 mg total) under the tongue every 5 (five) minutes as needed.  . Tamsulosin HCl (FLOMAX) 0.4 MG CAPS Take 0.4 mg by mouth daily.   . traMADol (ULTRAM) 50 MG tablet Take by mouth every 12 (twelve) hours as needed for moderate pain or severe pain.  . vitamin B-12 (CYANOCOBALAMIN) 1000 MCG tablet Take 1,000 mcg by mouth daily.  . vitamin C (ASCORBIC ACID) 500 MG tablet Take 500 mg by mouth daily.  . [DISCONTINUED] clopidogrel (PLAVIX) 75 MG tablet Take 1 tablet (75 mg total) by mouth daily. Patient must keep 07/20/17 appointment for further refills  . [DISCONTINUED] nitroGLYCERIN (NITROSTAT) 0.4 MG SL tablet Place 0.4 mg under the tongue every 5 (five) minutes as needed.      Allergies: Allergies  Allergen Reactions  . Oxycodone Other (See Comments)     LETHARGIC  . Oxycontin [Oxycodone Hcl] Other (See Comments)    LETHARGIC    Social History: The patient  reports that he quit smoking about 7 years ago. His smoking use included Cigarettes. He has a 60.00 pack-year smoking history. He has never used smokeless tobacco. He reports that he drinks about 8.4 oz of alcohol per week . He reports that he does not use drugs.   Family History: The patient's family history includes Congestive Heart Failure in his father; Heart attack in his mother; Heart disease in his father and mother; Prostate cancer in his brother.   Review of Systems: Please see the history of present illness.   Otherwise, the review of systems is positive for none.   All other systems are reviewed and negative.   Physical Exam: VS:  BP 132/68 (BP Location: Left Arm, Patient Position: Sitting, Cuff Size: Large)   Pulse 68   Ht 5\' 8"  (1.727 m)   Wt 230 lb 1.9 oz (104.4 kg)   BMI 34.99 kg/m  .  BMI Body mass index is 34.99 kg/m.  Wt Readings from Last 3 Encounters:  07/20/17 230 lb 1.9 oz (104.4 kg)  05/26/16 218 lb (98.9 kg)  08/21/13 232 lb 9.6 oz (105.5 kg)    General: Pleasant. Obese male. Alert and in no acute distress.   HEENT: Normal.  Neck: Supple, no JVD, carotid bruits, or masses noted.  Cardiac: Regular rate and rhythm. No murmurs, rubs, or gallops. No edema.  Respiratory:  Lungs are clear to auscultation bilaterally with normal work of breathing.  GI: Soft and nontender.  MS: No deformity or atrophy. Gait and ROM intact.  Skin: Warm and dry. Color is normal.  Neuro:  Strength and sensation are intact and no gross focal deficits noted.  Psych: Alert, appropriate and with normal affect.   LABORATORY DATA:  EKG:  EKG is ordered today. This demonstrates sinus bradycardia - HR is 58.  Lab Results  Component Value Date   WBC 6.6 08/02/2013   HGB 12.9 (L) 08/02/2013   HCT 37.1 (L) 08/02/2013   PLT 291 08/02/2013   GLUCOSE 90 05/26/2016   CHOL 132  05/26/2016   TRIG 121 05/26/2016   HDL 72 05/26/2016   LDLCALC 36 05/26/2016   ALT 12 05/14/2010   AST 32 05/14/2010   NA 139 05/26/2016   K 3.7 05/26/2016   CL 101 05/26/2016   CREATININE 0.89 05/26/2016   BUN 17 05/26/2016   CO2 23 05/26/2016   INR 1.01 05/14/2010  BNP (last 3 results) No results for input(s): BNP in the last 8760 hours.  ProBNP (last 3 results) No results for input(s): PROBNP in the last 8760 hours.   Other Studies Reviewed Today:   Assessment/Plan:  1. CAD - he is doing well. No angina. Needs to work on CV risk factor modification. Plavix refilled today.  Apparently has not been able to tolerate low dose aspirin in the past according to Dr. Claris Gladden last note - he may consider retrying.   2. HLD - on statin - most recent labs noted on [facility]  3. HTN - BP ok on current regimen  4. Prostate cancer - followed by [facility]  5. Lung nodule - he is going to be bring me a copy of his most recent CT for our records.   Current medicines are reviewed with the patient today.  The patient does not have concerns regarding medicines other than what has been noted above.  The following changes have been made:  See above.  Labs/ tests ordered today include:   No orders of the defined types were placed in this encounter.    Disposition:   He would like to see me in a year.   Patient is agreeable to this plan and will call if any problems develop in the interim.   SignedTruitt Merle, NP  07/20/2017 11:49 AM  [facility] [address] [place], [place]  [address] Phone: [phone] Fax: [phone]

## 2017-07-29 ENCOUNTER — Telehealth: Payer: Self-pay | Admitting: Nurse Practitioner

## 2017-07-29 NOTE — Telephone Encounter (Signed)
Returned call to patient he stated he was returning a call to [place].Advised I will send message to her.

## 2017-07-29 NOTE — Telephone Encounter (Signed)
F/u message  Pt states he is returning call to discuss scheduling CT scan. Please call back to discuss

## 2017-08-01 ENCOUNTER — Other Ambulatory Visit: Payer: Self-pay | Admitting: *Deleted

## 2017-08-01 ENCOUNTER — Encounter: Payer: Self-pay | Admitting: *Deleted

## 2017-08-01 DIAGNOSIS — R918 Other nonspecific abnormal finding of lung field: Secondary | ICD-10-CM

## 2017-08-01 DIAGNOSIS — R911 Solitary pulmonary nodule: Secondary | ICD-10-CM

## 2017-08-08 DIAGNOSIS — R351 Nocturia: Secondary | ICD-10-CM | POA: Diagnosis not present

## 2017-08-08 DIAGNOSIS — R3121 Asymptomatic microscopic hematuria: Secondary | ICD-10-CM | POA: Diagnosis not present

## 2017-08-08 DIAGNOSIS — Z8546 Personal history of malignant neoplasm of prostate: Secondary | ICD-10-CM | POA: Diagnosis not present

## 2017-09-08 DIAGNOSIS — Z23 Encounter for immunization: Secondary | ICD-10-CM | POA: Diagnosis not present

## 2017-09-15 DIAGNOSIS — I10 Essential (primary) hypertension: Secondary | ICD-10-CM | POA: Diagnosis not present

## 2017-09-15 DIAGNOSIS — E7800 Pure hypercholesterolemia, unspecified: Secondary | ICD-10-CM | POA: Diagnosis not present

## 2017-09-19 DIAGNOSIS — E7800 Pure hypercholesterolemia, unspecified: Secondary | ICD-10-CM | POA: Diagnosis not present

## 2017-09-19 DIAGNOSIS — I10 Essential (primary) hypertension: Secondary | ICD-10-CM | POA: Diagnosis not present

## 2017-09-19 DIAGNOSIS — M545 Low back pain: Secondary | ICD-10-CM | POA: Diagnosis not present

## 2017-09-19 DIAGNOSIS — Z6837 Body mass index (BMI) 37.0-37.9, adult: Secondary | ICD-10-CM | POA: Diagnosis not present

## 2017-09-19 DIAGNOSIS — Z79899 Other long term (current) drug therapy: Secondary | ICD-10-CM | POA: Diagnosis not present

## 2017-09-19 DIAGNOSIS — J449 Chronic obstructive pulmonary disease, unspecified: Secondary | ICD-10-CM | POA: Diagnosis not present

## 2017-10-03 ENCOUNTER — Ambulatory Visit (INDEPENDENT_AMBULATORY_CARE_PROVIDER_SITE_OTHER)
Admission: RE | Admit: 2017-10-03 | Discharge: 2017-10-03 | Disposition: A | Discharge status: Home / Self Care | Payer: Medicare Other | Source: Ambulatory Visit | Attending: Nurse Practitioner | Admitting: Nurse Practitioner

## 2017-10-03 DIAGNOSIS — J439 Emphysema, unspecified: Secondary | ICD-10-CM | POA: Diagnosis not present

## 2017-10-03 DIAGNOSIS — R918 Other nonspecific abnormal finding of lung field: Secondary | ICD-10-CM

## 2017-10-03 DIAGNOSIS — R911 Solitary pulmonary nodule: Secondary | ICD-10-CM | POA: Diagnosis not present

## 2018-01-06 DIAGNOSIS — E669 Obesity, unspecified: Secondary | ICD-10-CM | POA: Diagnosis not present

## 2018-01-06 DIAGNOSIS — M545 Low back pain: Secondary | ICD-10-CM | POA: Diagnosis not present

## 2018-01-06 DIAGNOSIS — Z8546 Personal history of malignant neoplasm of prostate: Secondary | ICD-10-CM | POA: Diagnosis not present

## 2018-01-06 DIAGNOSIS — Z6837 Body mass index (BMI) 37.0-37.9, adult: Secondary | ICD-10-CM | POA: Diagnosis not present

## 2018-01-06 DIAGNOSIS — R310 Gross hematuria: Secondary | ICD-10-CM | POA: Diagnosis not present

## 2018-01-09 DIAGNOSIS — Z8546 Personal history of malignant neoplasm of prostate: Secondary | ICD-10-CM | POA: Diagnosis not present

## 2018-01-09 DIAGNOSIS — R310 Gross hematuria: Secondary | ICD-10-CM | POA: Diagnosis not present

## 2018-01-12 DIAGNOSIS — N201 Calculus of ureter: Secondary | ICD-10-CM | POA: Diagnosis not present

## 2018-01-12 DIAGNOSIS — R310 Gross hematuria: Secondary | ICD-10-CM | POA: Diagnosis not present

## 2018-01-25 DIAGNOSIS — N35012 Post-traumatic membranous urethral stricture: Secondary | ICD-10-CM | POA: Diagnosis not present

## 2018-01-25 DIAGNOSIS — R310 Gross hematuria: Secondary | ICD-10-CM | POA: Diagnosis not present

## 2018-03-17 DIAGNOSIS — R310 Gross hematuria: Secondary | ICD-10-CM | POA: Diagnosis not present

## 2018-03-17 DIAGNOSIS — N201 Calculus of ureter: Secondary | ICD-10-CM | POA: Diagnosis not present

## 2018-03-17 DIAGNOSIS — N35012 Post-traumatic membranous urethral stricture: Secondary | ICD-10-CM | POA: Diagnosis not present

## 2018-03-17 DIAGNOSIS — C61 Malignant neoplasm of prostate: Secondary | ICD-10-CM | POA: Diagnosis not present

## 2018-03-21 DIAGNOSIS — I10 Essential (primary) hypertension: Secondary | ICD-10-CM | POA: Diagnosis not present

## 2018-03-21 DIAGNOSIS — E7800 Pure hypercholesterolemia, unspecified: Secondary | ICD-10-CM | POA: Diagnosis not present

## 2018-03-23 DIAGNOSIS — Z87891 Personal history of nicotine dependence: Secondary | ICD-10-CM | POA: Diagnosis not present

## 2018-03-23 DIAGNOSIS — M545 Low back pain: Secondary | ICD-10-CM | POA: Diagnosis not present

## 2018-03-23 DIAGNOSIS — I10 Essential (primary) hypertension: Secondary | ICD-10-CM | POA: Diagnosis not present

## 2018-03-23 DIAGNOSIS — E7800 Pure hypercholesterolemia, unspecified: Secondary | ICD-10-CM | POA: Diagnosis not present

## 2018-03-23 DIAGNOSIS — J449 Chronic obstructive pulmonary disease, unspecified: Secondary | ICD-10-CM | POA: Diagnosis not present

## 2018-03-23 DIAGNOSIS — Z79899 Other long term (current) drug therapy: Secondary | ICD-10-CM | POA: Diagnosis not present

## 2018-05-04 DIAGNOSIS — H25813 Combined forms of age-related cataract, bilateral: Secondary | ICD-10-CM | POA: Diagnosis not present

## 2018-07-05 ENCOUNTER — Encounter: Payer: Self-pay | Admitting: Nurse Practitioner

## 2018-07-12 DIAGNOSIS — Z6837 Body mass index (BMI) 37.0-37.9, adult: Secondary | ICD-10-CM | POA: Diagnosis not present

## 2018-07-12 DIAGNOSIS — R21 Rash and other nonspecific skin eruption: Secondary | ICD-10-CM | POA: Diagnosis not present

## 2018-07-25 NOTE — Progress Notes (Addendum)
CARDIOLOGY OFFICE NOTE  Date:  07/26/2018    Aaron King Date of Birth: [birth year] Medical Record #[id number]  PCP:  Philmore Pali, NP  Cardiologist:  Servando Snare    Chief Complaint  Patient presents with  . Coronary Artery Disease    1 year check.     History of Present Illness: Aaron King is a 72 y.o. male who presents today for a one year check. Former patient of Dr. Claris Gladden. Primarily follows with me.   He has a history of CAD s/p NSTEMI in 7/11. He had PCI with DES to OM at the time of his MI. Had just single vessel disease noted at that time. He was admitted in 9/14 with mid-scapular upper back pain. He had similar pain with his MI. The pain was worse with cough and with certain arm movements. It was reproduced by palpation. It lasted for about a day total. Cardiac enzymes remained negative. The patient was thought to have noncardiac chest pain and was sent home. Other issues include obesity, prostate CA, HTN and HLD. Apparently has not been able to tolerate low dose aspirin in the past according to a prior note from Dr. Aundra Dubin.    Last seen back in September of 2018. He was doing well. He remains on Plavix therapy solely. He had had a CT by [facility] - lung nodule noted - he asked me to follow. We got a scan back in November - this area favored scarring and repeat study recommended in 6 to 12 months. Extensive CAD noted as well.   Comes in today. Here alone. Doing well. Some DOE - sounds like it is not getting worse. He will use his inhaler and stop and rest. The hot weather has bothered him more this summer. He has had no chest pain. He is not exercising. Has had some blood in his urine - now resolved - saw urology. Tolerating his medicines. He really has no concerns. He brought me some pear preserves last time.   He dropped off more pear preserves while here in October.   Past Medical History:  Diagnosis Date  . CAD (coronary artery disease)    a. 05/2010  NSTEMI/Cath/PCI: LM 20, LAD min irregs, LCX min irregs, OM1 99(2.5x12 Promus DES), RCA 30p, EF 65%;  b. effient d/c 2/2 to gum bleeding and epistaxis  . Dyslipidemia   . ETOH abuse   . History of prostate cancer    s/p XRT and seed implantation in May 2010  . Hypertension   . Left Thigh Mass     Past Surgical History:  Procedure Laterality Date  . APPENDECTOMY  1960  . CORONARY ANGIOPLASTY WITH STENT PLACEMENT  05-15-2010  DR CRENSHAW   PCI W/ DRUG-ELUTING STENT X1 IN THE FIRST OBTUSE MARGINAL BRANCH OF CIRCUMFLEX ARTERY/ NON-STEMI/  EF 65%  . MASS EXCISION  08/23/2012   Procedure: EXCISION MASS;  Surgeon: Leighton Ruff, MD;  Location[facility];  Service: General;  Laterality: Left;  excisional biopsy left thigh mass  . RADIOACTIVE PROSTATE SEED IMPLANTS  03-31-2009   PROSTATE CANCER  . REPAIR RIGHT INGUINAL HERNIA  1980     Medications: Current Meds  Medication Sig  . albuterol (PROVENTIL HFA;VENTOLIN HFA) 108 (90 Base) MCG/ACT inhaler Inhale 2 puffs into the lungs every 4 (four) hours as needed for wheezing or shortness of breath.  Marland Kitchen atorvastatin (LIPITOR) 40 MG tablet Take 40 mg by mouth every evening.  . clopidogrel (PLAVIX) 75 MG  tablet Take 1 tablet (75 mg total) by mouth daily.  Marland Kitchen lisinopril (PRINIVIL,ZESTRIL) 10 MG tablet Take 10 mg by mouth daily.   . metoprolol succinate (TOPROL-XL) 25 MG 24 hr tablet Take 25 mg by mouth every morning.  . nitroGLYCERIN (NITROSTAT) 0.4 MG SL tablet Place 1 tablet (0.4 mg total) under the tongue every 5 (five) minutes as needed.  . Tamsulosin HCl (FLOMAX) 0.4 MG CAPS Take 0.4 mg by mouth daily.   . traMADol (ULTRAM) 50 MG tablet Take by mouth every 12 (twelve) hours as needed for moderate pain or severe pain.  Marland Kitchen triamcinolone cream (KENALOG) 0.1 % APPLY TOPICALLY TO RASH TWICE DAILY  . vitamin B-12 (CYANOCOBALAMIN) 1000 MCG tablet Take 1,000 mcg by mouth daily.  . vitamin C (ASCORBIC ACID) 500 MG tablet Take 500 mg by  mouth daily.     Allergies: Allergies  Allergen Reactions  . Oxycodone Other (See Comments)    LETHARGIC  . Oxycontin [Oxycodone Hcl] Other (See Comments)    LETHARGIC    Social History: The patient  reports that he quit smoking about 8 years ago. His smoking use included cigarettes. He has a 60.00 pack-year smoking history. He has never used smokeless tobacco. He reports that he drinks about 14.0 standard drinks of alcohol per week. He reports that he does not use drugs.   Family History: The patient's family history includes Congestive Heart Failure in his father; Heart attack in his mother; Heart disease in his father and mother; Prostate cancer in his brother.   Review of Systems: Please see the history of present illness.   Otherwise, the review of systems is positive for none.   All other systems are reviewed and negative.   Physical Exam: VS:  BP 136/80 (BP Location: Left Arm, Patient Position: Sitting, Cuff Size: Normal)   Pulse 72   Ht 5\' 8"  (1.727 m)   Wt 228 lb 6.4 oz (103.6 kg)   SpO2 97% Comment: at rest  BMI 34.73 kg/m  .  BMI Body mass index is 34.73 kg/m.  Wt Readings from Last 3 Encounters:  07/26/18 228 lb 6.4 oz (103.6 kg)  07/20/17 230 lb 1.9 oz (104.4 kg)  05/26/16 218 lb (98.9 kg)    General: Pleasant. Obese. Alert and in no acute distress.   HEENT: Normal.  Neck: Supple, no JVD, carotid bruits, or masses noted.  Cardiac: Regular rate and rhythm. No murmurs, rubs, or gallops. No edema.  Respiratory:  Lungs are clear to auscultation bilaterally with normal work of breathing.  GI: Soft and nontender.  MS: No deformity or atrophy. Gait and ROM intact.  Skin: Warm and dry. Color is normal.  Neuro:  Strength and sensation are intact and no gross focal deficits noted.  Psych: Alert, appropriate and with normal affect.   LABORATORY DATA:  EKG:  EKG is ordered today. This demonstrates NSR.  Lab Results  Component Value Date   WBC 6.6 08/02/2013    HGB 12.9 (L) 08/02/2013   HCT 37.1 (L) 08/02/2013   PLT 291 08/02/2013   GLUCOSE 90 05/26/2016   CHOL 132 05/26/2016   TRIG 121 05/26/2016   HDL 72 05/26/2016   LDLCALC 36 05/26/2016   ALT 12 05/14/2010   AST 32 05/14/2010   NA 139 05/26/2016   K 3.7 05/26/2016   CL 101 05/26/2016   CREATININE 0.89 05/26/2016   BUN 17 05/26/2016   CO2 23 05/26/2016   INR 1.01 05/14/2010  BNP (last 3 results) No results for input(s): BNP in the last 8760 hours.  ProBNP (last 3 results) No results for input(s): PROBNP in the last 8760 hours.   Other Studies Reviewed Today:   Assessment/Plan: 1. CAD - remote PCI for single vessel CAD in 2011 - he has no active chest pain. Noted extensive CAD on his last CT - would like to get a [employer] - further disposition to follow. He has not been very successful with CV risk factor modification. Encouragement given. No med changes today. NTG refilled today.   2. HLD - he remains on statin - labs from [facility] noted.   3. HTN -BP looks ok on his current regimen.   4. Prostate cancer - followed by [facility]  5. Lung nodule - he would like to get his follow up CT done.   Current medicines are reviewed with the patient today.  The patient does not have concerns regarding medicines other than what has been noted above.  The following changes have been made:  See above.  Labs/ tests ordered today include:    Orders Placed This Encounter  Procedures  . CT CHEST WO CONTRAST  . Basic metabolic panel  . MYOCARDIAL PERFUSION IMAGING  . EKG 12-Lead     Disposition:   Tentatively will see in a year as long as his stress test looks ok.   Patient is agreeable to this plan and will call if any problems develop in the interim.   SignedTruitt Merle, NP  07/26/2018 10:56 AM  [facility] [address] [place], [place]  [address] Phone: [phone] Fax: [phone]

## 2018-07-26 ENCOUNTER — Encounter: Payer: Self-pay | Admitting: Nurse Practitioner

## 2018-07-26 ENCOUNTER — Ambulatory Visit (INDEPENDENT_AMBULATORY_CARE_PROVIDER_SITE_OTHER): Payer: Medicare Other | Admitting: Nurse Practitioner

## 2018-07-26 VITALS — BP 136/80 | HR 72 | Ht 68.0 in | Wt 228.4 lb

## 2018-07-26 DIAGNOSIS — R911 Solitary pulmonary nodule: Secondary | ICD-10-CM

## 2018-07-26 DIAGNOSIS — I2510 Atherosclerotic heart disease of native coronary artery without angina pectoris: Secondary | ICD-10-CM

## 2018-07-26 DIAGNOSIS — I10 Essential (primary) hypertension: Secondary | ICD-10-CM | POA: Diagnosis not present

## 2018-07-26 DIAGNOSIS — R918 Other nonspecific abnormal finding of lung field: Secondary | ICD-10-CM

## 2018-07-26 DIAGNOSIS — E7800 Pure hypercholesterolemia, unspecified: Secondary | ICD-10-CM

## 2018-07-26 LAB — BASIC METABOLIC PANEL
BUN/Creatinine Ratio: 18 (ref 10–24)
BUN: 17 mg/dL (ref 8–27)
CO2: 24 mmol/L (ref 20–29)
Calcium: 9.8 mg/dL (ref 8.6–10.2)
Chloride: 95 mmol/L — ABNORMAL LOW (ref 96–106)
Creatinine, Ser: 0.95 mg/dL (ref 0.76–1.27)
GFR calc Af Amer: 93 mL/min/{1.73_m2} (ref >59)
GFR calc non Af Amer: 80 mL/min/{1.73_m2} (ref >59)
Glucose: 90 mg/dL (ref 65–99)
Potassium: 5 mmol/L (ref 3.5–5.2)
Sodium: 133 mmol/L — ABNORMAL LOW (ref 134–144)

## 2018-07-26 MED ORDER — NITROGLYCERIN 0.4 MG SL SUBL: 0.4000 mg | SUBLINGUAL_TABLET | SUBLINGUAL | 12 refills | Status: DC | PRN

## 2018-07-26 NOTE — Patient Instructions (Addendum)
We will be checking the following labs today - BMET   Medication Instructions:    Continue with your current medicines.     Testing/Procedures To Be Arranged:  FU CT of the chest for his lung nodule  One day Lexiscan Myoview  Follow-Up:   See me in one year    Other Special Instructions:  You are scheduled for a Myocardial Perfusion Imaging Study on __________________________________ at _______________________________________________.   Please arrive 15 minutes prior to your appointment time for registration and insurance purposes.   The test will take approximately 3 to 4 hours to complete; you may bring reading material. If someone comes with you to your appointment, they will need to remain in the main lobby due to limited space in the testing area.    How to prepare for your Myocardial Perfusion test:   Do not eat or drink 3 hours prior to your test, except you may have water.    Do not consume products containing caffeine (regular or decaffeinated) 12 hours prior to your test (ex: coffee, chocolate, soda, tea)   Do bring a list of your current medications with you. If not listed below, you may take your medications as normal.     Bring any held medication to your appointment, as you may be required to take it once the test is complete.   Do wear comfortable clothes (no dresses or overalls) and walking shoes. Tennis shoes are preferred. No heels or open toed shoes.  Do not wear cologne, perfume, aftershave or lotions (deodorant is allowed).   If these instructions are not followed, you test will have to be rescheduled.   Please report to [address] for your test. If you have questions or concerns about your appointment, please call the Nuclear Lab at [phone].  If you cannot keep your appointment, please provide 24 hour notification to the Nuclear lab to avoid a possible $50 charge to your account.           If you need a  refill on your cardiac medications before your next appointment, please call your pharmacy.   Call the [place] office at [phone] if you have any questions, problems or concerns.

## 2018-08-08 ENCOUNTER — Telehealth (HOSPITAL_COMMUNITY): Payer: Self-pay | Admitting: *Deleted

## 2018-08-08 NOTE — Telephone Encounter (Signed)
Patient given detailed instructions per Myocardial Perfusion Study Information Sheet for the test on 08/11/18. Patient notified to arrive 15 minutes early and that it is imperative to arrive on time for appointment to keep from having the test rescheduled.  If you need to cancel or reschedule your appointment, please call the office within 24 hours of your appointment. . Patient verbalized understanding. [name]

## 2018-08-11 ENCOUNTER — Ambulatory Visit (INDEPENDENT_AMBULATORY_CARE_PROVIDER_SITE_OTHER)
Admission: RE | Admit: 2018-08-11 | Discharge: 2018-08-11 | Disposition: A | Discharge status: Home / Self Care | Payer: Medicare Other | Source: Ambulatory Visit | Attending: Nurse Practitioner | Admitting: Nurse Practitioner

## 2018-08-11 ENCOUNTER — Other Ambulatory Visit: Payer: Medicare Other

## 2018-08-11 ENCOUNTER — Ambulatory Visit (HOSPITAL_COMMUNITY): Payer: Medicare Other | Attending: Cardiology

## 2018-08-11 DIAGNOSIS — R911 Solitary pulmonary nodule: Secondary | ICD-10-CM | POA: Insufficient documentation

## 2018-08-11 DIAGNOSIS — R918 Other nonspecific abnormal finding of lung field: Secondary | ICD-10-CM

## 2018-08-11 DIAGNOSIS — E785 Hyperlipidemia, unspecified: Secondary | ICD-10-CM | POA: Diagnosis not present

## 2018-08-11 DIAGNOSIS — I10 Essential (primary) hypertension: Secondary | ICD-10-CM | POA: Insufficient documentation

## 2018-08-11 DIAGNOSIS — I2510 Atherosclerotic heart disease of native coronary artery without angina pectoris: Secondary | ICD-10-CM

## 2018-08-11 LAB — MYOCARDIAL PERFUSION IMAGING
LV dias vol: 59 mL (ref 62–150)
LV sys vol: 19 mL
Peak HR: 85 {beats}/min
Rest HR: 56 {beats}/min
SDS: 1
SRS: 0
SSS: 1
TID: 1.02

## 2018-08-11 MED ORDER — TECHNETIUM TC 99M TETROFOSMIN IV KIT
32.9000 | PACK | Freq: Once | INTRAVENOUS | Status: AC | PRN
  Administered 2018-08-11: 32.9 via INTRAVENOUS
  Filled 2018-08-11: qty 33

## 2018-08-11 MED ORDER — TECHNETIUM TC 99M TETROFOSMIN IV KIT
10.2000 | PACK | Freq: Once | INTRAVENOUS | Status: AC | PRN
  Administered 2018-08-11: 10.2 via INTRAVENOUS
  Filled 2018-08-11: qty 11

## 2018-08-11 MED ORDER — REGADENOSON 0.4 MG/5ML IV SOLN
0.4000 mg | Freq: Once | INTRAVENOUS | Status: AC
  Administered 2018-08-11: 0.4 mg via INTRAVENOUS

## 2018-08-14 ENCOUNTER — Telehealth: Payer: Self-pay

## 2018-08-14 NOTE — Telephone Encounter (Signed)
-----   Message from Burtis Junes, NP sent at 08/11/2018  3:51 PM EDT ----- Ok to report. See the Myoview result as well.  The CT shows that the previous lung nodule has completely resolved. This is great news! Does have atherosclerosis and emphysema noted - but no acute issues - needs to work on risk factors as we have discussed.

## 2018-08-14 NOTE — Telephone Encounter (Signed)
Notes recorded by Frederik Schmidt, RN on 08/14/2018 at 8:40 AM EDT [id number] 10/7 ------

## 2018-08-18 ENCOUNTER — Other Ambulatory Visit: Payer: Self-pay | Admitting: Nurse Practitioner

## 2018-09-04 DIAGNOSIS — N390 Urinary tract infection, site not specified: Secondary | ICD-10-CM | POA: Diagnosis not present

## 2018-09-04 DIAGNOSIS — Z6836 Body mass index (BMI) 36.0-36.9, adult: Secondary | ICD-10-CM | POA: Diagnosis not present

## 2018-09-04 DIAGNOSIS — Z23 Encounter for immunization: Secondary | ICD-10-CM | POA: Diagnosis not present

## 2018-09-13 DIAGNOSIS — C61 Malignant neoplasm of prostate: Secondary | ICD-10-CM | POA: Diagnosis not present

## 2018-09-13 DIAGNOSIS — E785 Hyperlipidemia, unspecified: Secondary | ICD-10-CM | POA: Diagnosis not present

## 2018-09-13 DIAGNOSIS — R310 Gross hematuria: Secondary | ICD-10-CM | POA: Diagnosis not present

## 2018-09-13 DIAGNOSIS — R7309 Other abnormal glucose: Secondary | ICD-10-CM | POA: Diagnosis not present

## 2018-09-20 DIAGNOSIS — Z8546 Personal history of malignant neoplasm of prostate: Secondary | ICD-10-CM | POA: Diagnosis not present

## 2018-09-20 DIAGNOSIS — R3912 Poor urinary stream: Secondary | ICD-10-CM | POA: Diagnosis not present

## 2018-09-22 ENCOUNTER — Other Ambulatory Visit: Payer: Self-pay

## 2018-09-25 DIAGNOSIS — I10 Essential (primary) hypertension: Secondary | ICD-10-CM | POA: Diagnosis not present

## 2018-09-25 DIAGNOSIS — E7800 Pure hypercholesterolemia, unspecified: Secondary | ICD-10-CM | POA: Diagnosis not present

## 2018-09-28 DIAGNOSIS — I10 Essential (primary) hypertension: Secondary | ICD-10-CM | POA: Diagnosis not present

## 2018-09-28 DIAGNOSIS — E7800 Pure hypercholesterolemia, unspecified: Secondary | ICD-10-CM | POA: Diagnosis not present

## 2018-09-28 DIAGNOSIS — Z9181 History of falling: Secondary | ICD-10-CM | POA: Diagnosis not present

## 2018-09-28 DIAGNOSIS — J449 Chronic obstructive pulmonary disease, unspecified: Secondary | ICD-10-CM | POA: Diagnosis not present

## 2018-09-28 DIAGNOSIS — Z1331 Encounter for screening for depression: Secondary | ICD-10-CM | POA: Diagnosis not present

## 2018-09-28 DIAGNOSIS — Z79899 Other long term (current) drug therapy: Secondary | ICD-10-CM | POA: Diagnosis not present

## 2018-09-28 DIAGNOSIS — M545 Low back pain: Secondary | ICD-10-CM | POA: Diagnosis not present

## 2018-10-13 IMAGING — CT CT CHEST W/O CM
2 of 3 series · 15 of 36 positions shown, 18 images · non-contrast
Comparison: Chest CT 10/03/2017. CT Abdomen and Pelvis 03/31/2017.
Low-dose screening chest CT 08/28/2015.

CLINICAL DATA: 72-year-old male with right lower lobe lung nodule
1st identified on CT Abdomen and Pelvis in March 2017. Subsequent
encounter.

EXAM:
CT CHEST WITHOUT CONTRAST
TECHNIQUE: Multidetector CT imaging of the chest was performed following the
standard protocol without IV contrast.

[Series 2: thorax · axial · 0.83mm/px · z∈[-286,-10]mm · 12 of 163 slices shown, 15 images]
[im 13/163  mediastinal]
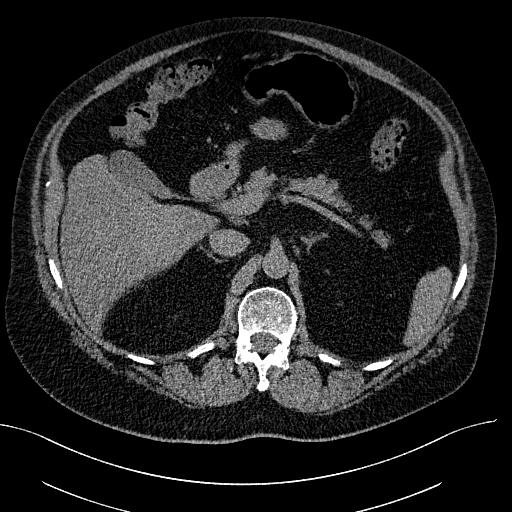
[im 13/163  lung]
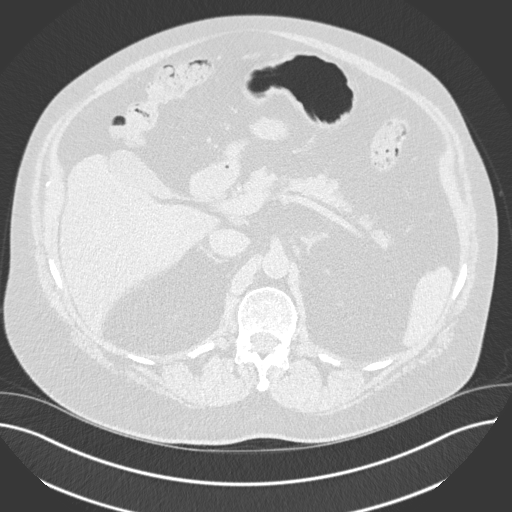
[im 25/163  lung]
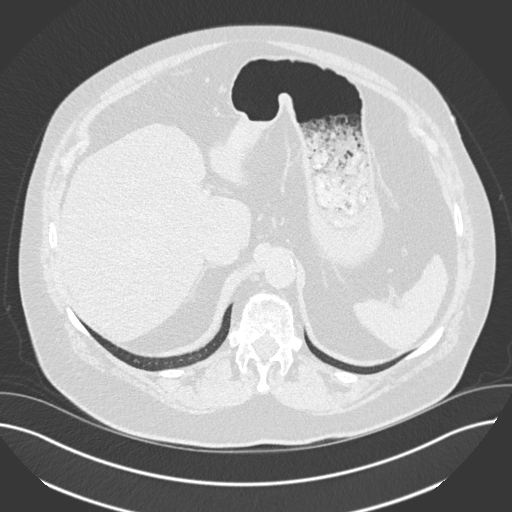
[im 37/163  lung]
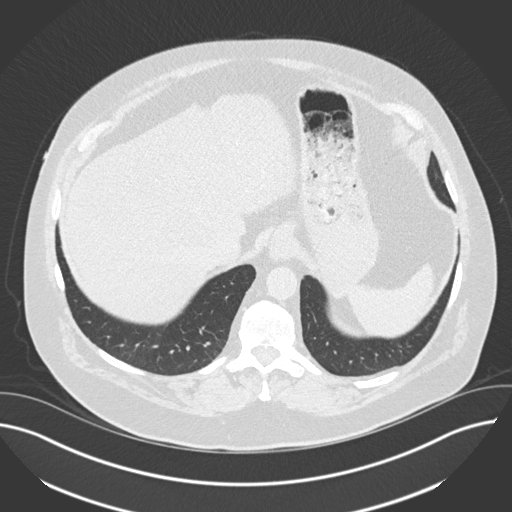
[im 49/163  lung]
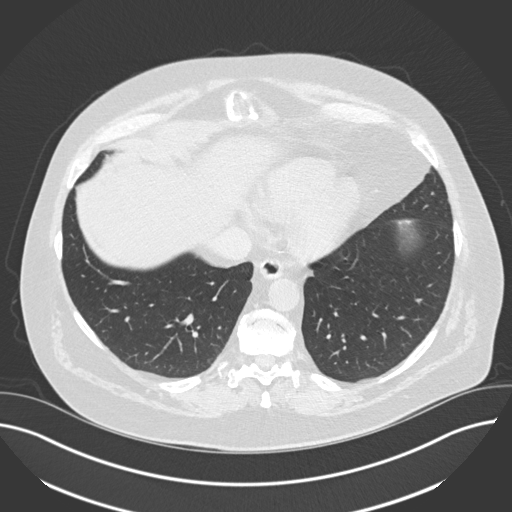
[im 61/163  mediastinal]
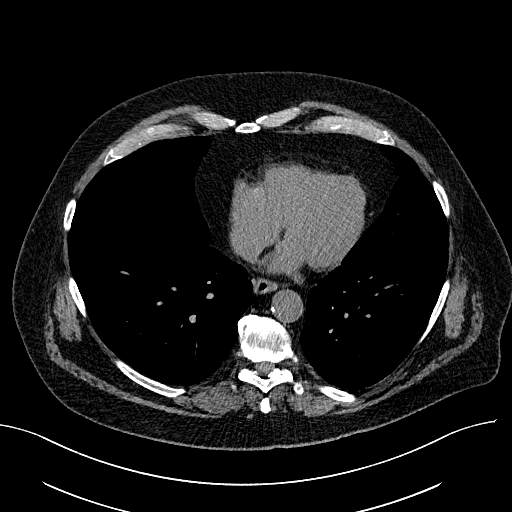
[im 61/163  lung]
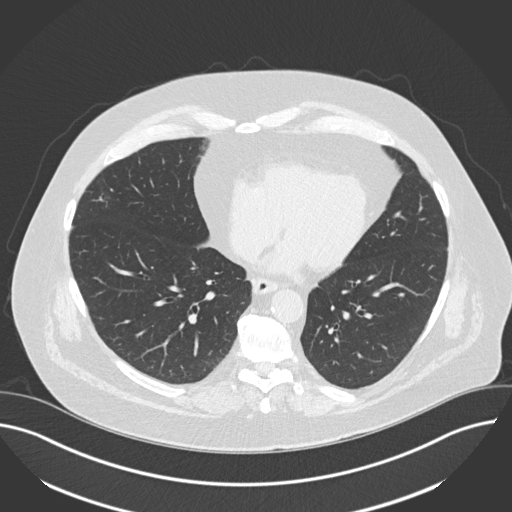
[im 73/163  lung]
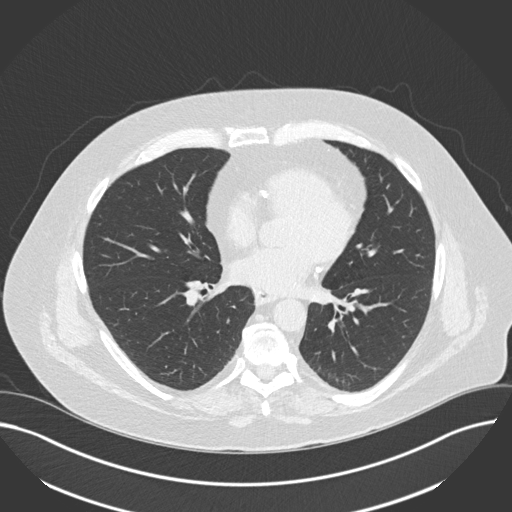
[im 91/163  lung]
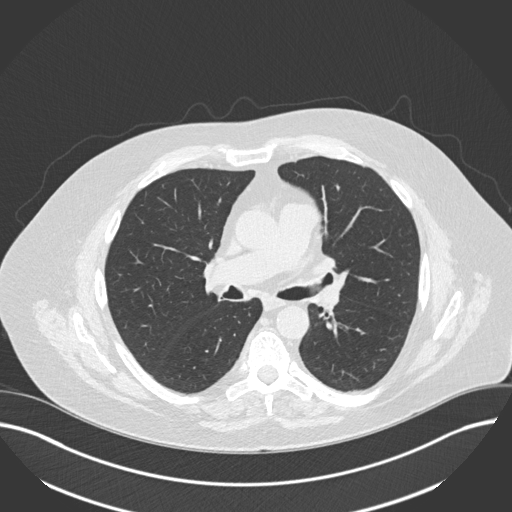
[im 103/163  lung]
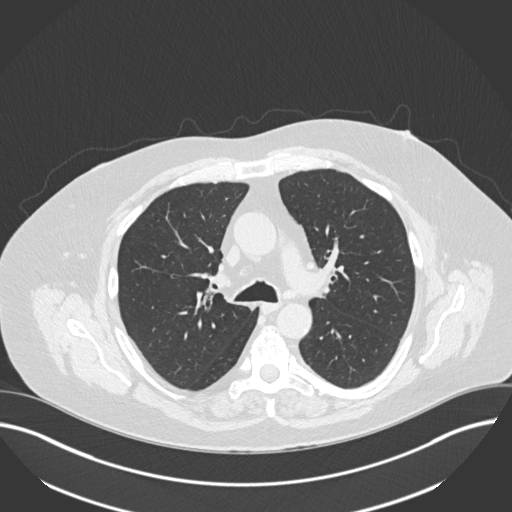
[im 115/163  mediastinal]
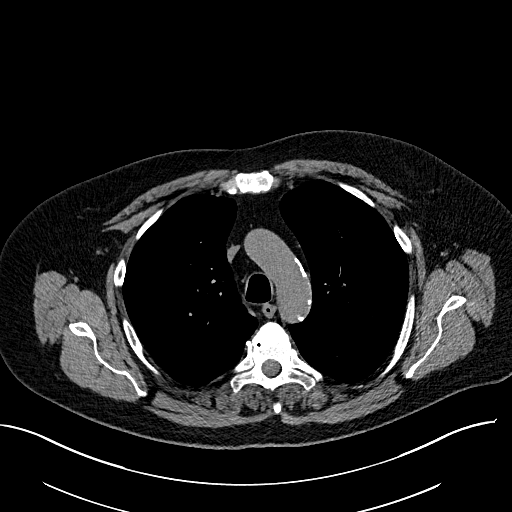
[im 115/163  lung]
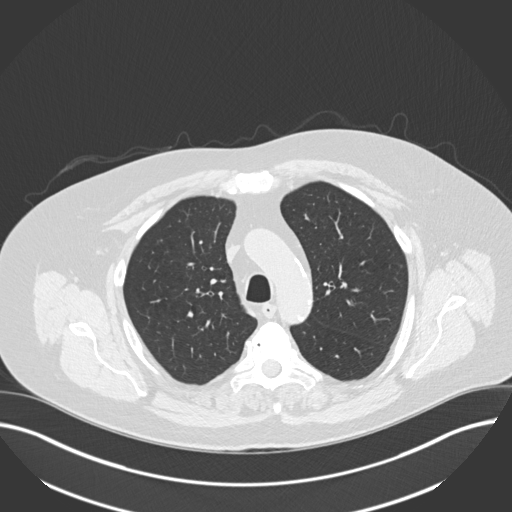
[im 127/163  lung]
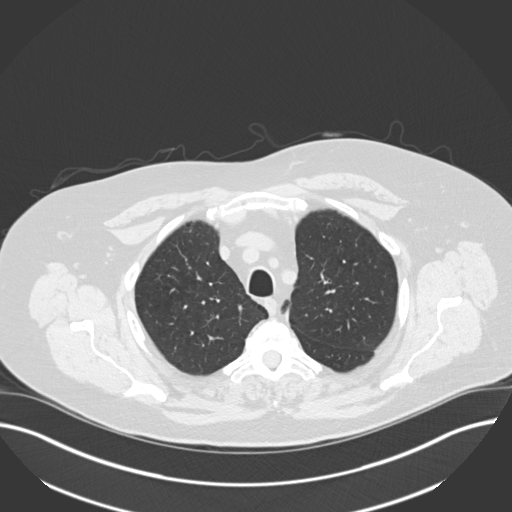
[im 139/163  lung]
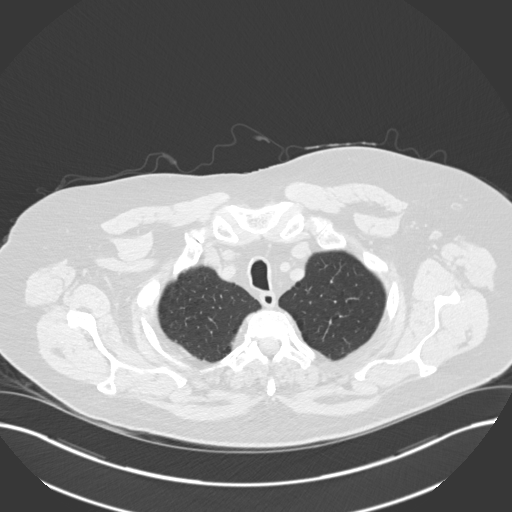
[im 151/163  lung]
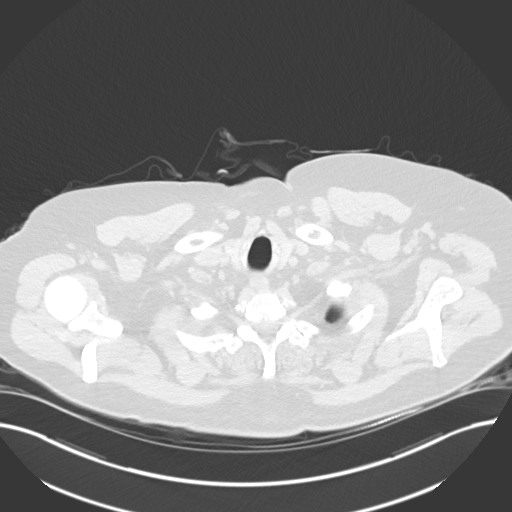

[Series 5: coronal · coronal · 0.63mm/px · 3 of 152 slices shown]
[im 31/152  lung]
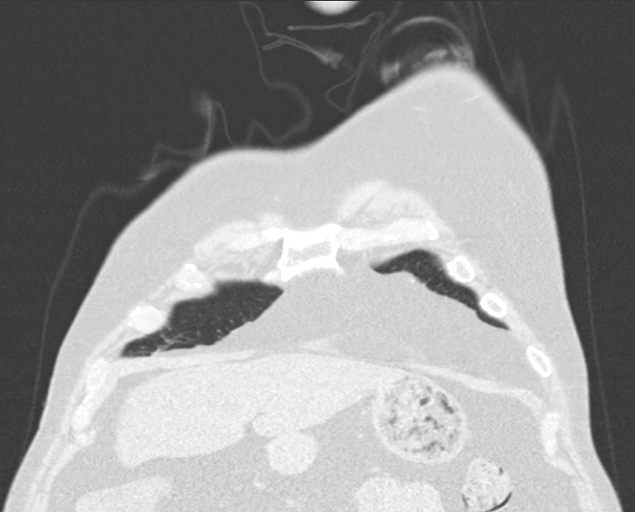
[im 61/152  lung]
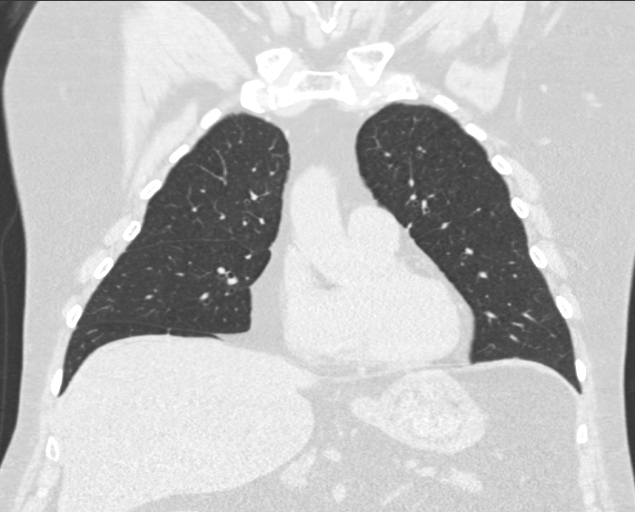
[im 91/152  lung]
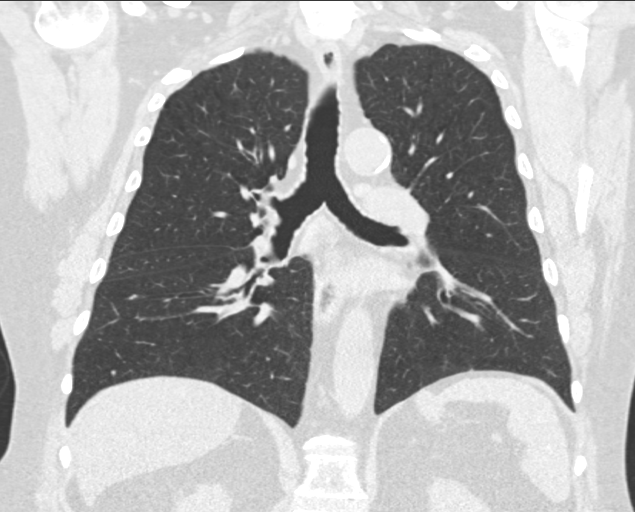

[15 of 36 positions shown; findings below may reference images not displayed]

FINDINGS: Cardiovascular: Calcified aortic atherosclerosis. Calcified coronary
artery atherosclerosis and/or stents redemonstrated. No cardiomegaly
or pericardial effusion.

Mediastinum/Nodes: Stable and negative.

Lungs/Pleura: The right lower lobe costophrenic angle nodule/opacity
has resolved.

Upper lobe centrilobular emphysema is evident. No new right lung
opacity. No left lung nodule. No pleural effusion.

Major airways remain patent.

Upper Abdomen: Hepatic steatosis is less apparent since September 2017. Otherwise negative.

Musculoskeletal: No acute osseous abnormality identified.
IMPRESSION: 1. The right lower lobe nodule has completely resolved since March 2017. No acute pulmonary process or lung nodule identified today.
2. Aortic Atherosclerosis (TFRFI-M3D.D) and Emphysema (TFRFI-GSZ.P).
Calcified coronary artery atherosclerosis.

## 2018-11-06 DIAGNOSIS — Z125 Encounter for screening for malignant neoplasm of prostate: Secondary | ICD-10-CM | POA: Diagnosis not present

## 2018-11-06 DIAGNOSIS — Z0000 Encounter for general adult medical examination without abnormal findings: Secondary | ICD-10-CM | POA: Diagnosis not present

## 2018-11-06 DIAGNOSIS — Z136 Encounter for screening for cardiovascular disorders: Secondary | ICD-10-CM | POA: Diagnosis not present

## 2018-11-06 DIAGNOSIS — Z139 Encounter for screening, unspecified: Secondary | ICD-10-CM | POA: Diagnosis not present

## 2018-11-06 DIAGNOSIS — E785 Hyperlipidemia, unspecified: Secondary | ICD-10-CM | POA: Diagnosis not present

## 2018-11-06 DIAGNOSIS — Z6836 Body mass index (BMI) 36.0-36.9, adult: Secondary | ICD-10-CM | POA: Diagnosis not present

## 2018-11-06 DIAGNOSIS — E669 Obesity, unspecified: Secondary | ICD-10-CM | POA: Diagnosis not present

## 2019-01-16 DIAGNOSIS — N35012 Post-traumatic membranous urethral stricture: Secondary | ICD-10-CM | POA: Diagnosis not present

## 2019-01-16 DIAGNOSIS — R310 Gross hematuria: Secondary | ICD-10-CM | POA: Diagnosis not present

## 2019-01-19 ENCOUNTER — Telehealth: Payer: Self-pay | Admitting: Nurse Practitioner

## 2019-01-19 NOTE — Telephone Encounter (Signed)
° °  ° °  [facility] Pre-operative Risk Assessment    Request for surgical clearance:  1. What type of surgery is being performed? CYSTOSCOPY,  URETHRAL STRICTURE DILATION  2. When is this surgery scheduled? TBD  3. What type of clearance is required (medical clearance vs. Pharmacy clearance to hold med vs. Both)? BOTH  4. Are there any medications that need to be held prior to surgery and how long? PLAVIX - 5 DAYS  5. Practice name and name of physician performing surgery? [facility]  6. What is your office phone number [phone]    7.   What is your office fax number [phone]  8.   Anesthesia type (None, local, MAC, general) ? [place] 01/19/2019, 10:15 AM  _________________________________________________________________   (provider comments below)

## 2019-01-19 NOTE — Telephone Encounter (Signed)
I called pt to address cardiac clearance. Pt informed me that he has an appointment with Truitt Merle, NP, on 01/24/19 for preop evaluation. Truitt Merle will address clearance and will update requesting provider after office visit. I will remove from preop pool.

## 2019-01-24 ENCOUNTER — Ambulatory Visit (INDEPENDENT_AMBULATORY_CARE_PROVIDER_SITE_OTHER): Payer: Medicare Other | Admitting: Nurse Practitioner

## 2019-01-24 ENCOUNTER — Encounter: Payer: Self-pay | Admitting: Nurse Practitioner

## 2019-01-24 ENCOUNTER — Other Ambulatory Visit: Payer: Self-pay

## 2019-01-24 VITALS — BP 110/60 | HR 63 | Ht 68.0 in | Wt 234.4 lb

## 2019-01-24 DIAGNOSIS — R911 Solitary pulmonary nodule: Secondary | ICD-10-CM | POA: Diagnosis not present

## 2019-01-24 DIAGNOSIS — I2510 Atherosclerotic heart disease of native coronary artery without angina pectoris: Secondary | ICD-10-CM

## 2019-01-24 NOTE — Patient Instructions (Addendum)
We will be checking the following labs today - NONE  Medication Instructions:    Continue with your current medicines.    If you need a refill on your cardiac medications before your next appointment, please call your pharmacy.     Testing/Procedures To Be Arranged:  N/A  Follow-Up:   See me as planned    At [facility], you and your health needs are our priority.  As part of our continuing mission to provide you with exceptional heart care, we have created designated Provider Care Teams.  These Care Teams include your primary Cardiologist (physician) and Advanced Practice Providers (APPs -  Physician Assistants and Nurse Practitioners) who all work together to provide you with the care you need, when you need it.  Special Instructions:  . We will hold the Plavix for 5 days prior to your surgery.  . I would like for you to use baby aspirin 81 mg daily for those 5 days  Call the [place] office at [phone] if you have any questions, problems or concerns.

## 2019-01-24 NOTE — Progress Notes (Signed)
CARDIOLOGY OFFICE NOTE  Date:  01/24/2019    Aaron King Date of Birth: 12-03-1945 Medical Record #[id number]  PCP:  Philmore Pali, NP  Cardiologist:  Servando Snare   Chief Complaint  Patient presents with  . Pre-op Exam    History of Present Illness: Aaron King is a 73 y.o. male who presents today for a pre op visit. Former patient of Dr. Claris Gladden. Primarily follows with me.   He has ahistory of CAD s/p NSTEMI in 7/11. Hehad PCI with DES to OM at the time of his MI. Had just single vessel disease noted at that time. He was admitted in 9/14 with mid-scapular upper back pain. He had similar pain with his MI. The pain was worse with cough and with certain arm movements. It was reproduced by palpation. It lasted for about a day total. Cardiac enzymes remained negative. The patient was thought to have noncardiac chest pain and was sent home.Other issues include obesity, prostate CA, HTN and HLD. Apparently had not been able to tolerate low dose aspirin in the past according to a prior note from Dr. Aundra Dubin.    Last seen back in September of 2018. He was doing well. He remains on Plavix therapy solely. He had had a CT by [facility] - lung nodule noted - he asked me to follow. We got a scan back in November - this area favored scarring and repeat study recommended in 6 to 12 months. Extensive CAD noted as well.   I last saw him in September of 2019 - he was doing well - some DOE - seemed stable and more related to the hot weather. No chest pain.   Patient and friend screened for recent travel, fever, URI symptoms and shortness of breath. Patient and friend deny travel over the last 14 days and are currently without symptoms.    Comes in today. Here with his friend. They both have masks on. They are not sick. Friend smokes.  Aaron King is having issues with voiding - needs cysto and urethral dilatation - to be in the OR - apparently had too much discomfort with procedure that was attempted in the  office - currently not scheduled - needs Plavix addressed as well. No chest pain. He can walk around the block. He can go up a flight of stairs. He does all of his ADL's. He is more sedentary in the winter. Says he weighed 220# at home this morning. He notes that he had remote bleeding with full dose aspirin in the remote past. Currently no bleeding noted.   Past Medical History:  Diagnosis Date  . CAD (coronary artery disease)    a. 05/2010 NSTEMI/Cath/PCI: LM 20, LAD min irregs, LCX min irregs, OM1 99(2.5x12 Promus DES), RCA 30p, EF 65%;  b. effient d/c 2/2 to gum bleeding and epistaxis  . Dyslipidemia   . ETOH abuse   . History of prostate cancer    s/p XRT and seed implantation in May 2010  . Hypertension   . Left Thigh Mass     Past Surgical History:  Procedure Laterality Date  . APPENDECTOMY  1960  . CORONARY ANGIOPLASTY WITH STENT PLACEMENT  05-15-2010  DR CRENSHAW   PCI W/ DRUG-ELUTING STENT X1 IN THE FIRST OBTUSE MARGINAL BRANCH OF CIRCUMFLEX ARTERY/ NON-STEMI/  EF 65%  . MASS EXCISION  08/23/2012   Procedure: EXCISION MASS;  Surgeon: Leighton Ruff, MD;  Location: [facility];  Service: General;  Laterality: Left;  excisional  biopsy left thigh mass  . RADIOACTIVE PROSTATE SEED IMPLANTS  03-31-2009   PROSTATE CANCER  . REPAIR RIGHT INGUINAL HERNIA  1980     Medications: Current Meds  Medication Sig  . albuterol (PROVENTIL HFA;VENTOLIN HFA) 108 (90 Base) MCG/ACT inhaler Inhale 2 puffs into the lungs every 4 (four) hours as needed for wheezing or shortness of breath.  Marland Kitchen atorvastatin (LIPITOR) 40 MG tablet Take 40 mg by mouth every evening.  . clopidogrel (PLAVIX) 75 MG tablet TAKE 1 TABLET BY MOUTH ONCE DAILY  . lisinopril (PRINIVIL,ZESTRIL) 10 MG tablet Take 10 mg by mouth daily.   . metoprolol succinate (TOPROL-XL) 25 MG 24 hr tablet Take 25 mg by mouth every morning.  . nitroGLYCERIN (NITROSTAT) 0.4 MG SL tablet Place 1 tablet (0.4 mg total) under the tongue  every 5 (five) minutes as needed.  . Tamsulosin HCl (FLOMAX) 0.4 MG CAPS Take 0.4 mg by mouth daily.   Marland Kitchen triamcinolone cream (KENALOG) 0.1 % APPLY TOPICALLY TO RASH TWICE DAILY  . vitamin C (ASCORBIC ACID) 500 MG tablet Take 500 mg by mouth daily.     Allergies: Allergies  Allergen Reactions  . Oxycodone Other (See Comments)    LETHARGIC  . Oxycontin [Oxycodone Hcl] Other (See Comments)    LETHARGIC    Social History: The patient  reports that he quit smoking about 8 years ago. His smoking use included cigarettes. He has a 60.00 pack-year smoking history. He has never used smokeless tobacco. He reports current alcohol use of about 14.0 standard drinks of alcohol per week. He reports that he does not use drugs.   Family History: The patient's family history includes Congestive Heart Failure in his father; Heart attack in his mother; Heart disease in his father and mother; Prostate cancer in his brother.   Review of Systems: Please see the history of present illness.   Otherwise, the review of systems is positive for none.   All other systems are reviewed and negative.   Physical Exam: VS:  BP 110/60 (BP Location: Left Arm, Patient Position: Sitting, Cuff Size: Large)   Pulse 63   Ht 5\' 8"  (1.727 m)   Wt 234 lb 6.4 oz (106.3 kg)   BMI 35.64 kg/m  .  BMI Body mass index is 35.64 kg/m.  Wt Readings from Last 3 Encounters:  01/24/19 234 lb 6.4 oz (106.3 kg)  08/11/18 222 lb 9.6 oz (101 kg)  07/26/18 228 lb 6.4 oz (103.6 kg)    General: Pleasant. Obese. Alert and in no acute distress. His weight is up by our scales.  HEENT: Normal.  Neck: Supple, no JVD, carotid bruits, or masses noted.  Cardiac: Regular rate and rhythm. No murmurs, rubs, or gallops. No edema.  Respiratory:  Lungs are clear to auscultation bilaterally with normal work of breathing.  GI: Soft and nontender.  MS: No deformity or atrophy. Gait and ROM intact.  Skin: Warm and dry. Color is normal.  Neuro:   Strength and sensation are intact and no gross focal deficits noted.  Psych: Alert, appropriate and with normal affect.   LABORATORY DATA:  EKG:  EKG is ordered today. This demonstrates NSR.  Lab Results  Component Value Date   WBC 6.6 08/02/2013   HGB 12.9 (L) 08/02/2013   HCT 37.1 (L) 08/02/2013   PLT 291 08/02/2013   GLUCOSE 90 07/26/2018   CHOL 132 05/26/2016   TRIG 121 05/26/2016   HDL 72 05/26/2016   LDLCALC 36 05/26/2016  ALT 12 05/14/2010   AST 32 05/14/2010   NA 133 (L) 07/26/2018   K 5.0 07/26/2018   CL 95 (L) 07/26/2018   CREATININE 0.95 07/26/2018   BUN 17 07/26/2018   CO2 24 07/26/2018   INR 1.01 05/14/2010       BNP (last 3 results) No results for input(s): BNP in the last 8760 hours.  ProBNP (last 3 results) No results for input(s): PROBNP in the last 8760 hours.   Other Studies Reviewed Today:  Myoview Study Highlights 08/2018   The left ventricular ejection fraction is hyperdynamic (>65%).  Nuclear stress EF: 67%.  There was no ST segment deviation noted during stress.  The study is normal.  This is a low risk study.       Assessment/Plan: 1. Pre op clearance - he had low risk Myoview 6 months ago - he has no active symptoms - ok to proceed. Will hold Plavix for 5 days prior. He will use baby aspirin only those 5 days due to known CAD, remote DES stent.   2. CAD - remote PCI/DES stent for single vessel OM CAD in 2011 - residual disease noted - he has no active chest pain. Noted extensive CAD on his last CT - low risk Myoview from October - he has not done a great job with CV risk factor modification. Encouragement given again. No med changes today.   3. HLD - he remains on statin - labs from [facility] noted.   4. HTN -BP ok here today - no changes made.   5. Prostate cancer - followed by [facility] - now with void issues - procedure planned.   6. Lung nodule - resolved off his scan from 08/2018 - no further follow up was recommended. Not  discussed today.   Current medicines are reviewed with the patient today.  The patient does not have concerns regarding medicines other than what has been noted above.  The following changes have been made:  See above.  Labs/ tests ordered today include:    Orders Placed This Encounter  Procedures  . EKG 12-Lead     Disposition:   FU with me as planned in September.    Patient is agreeable to this plan and will call if any problems develop in the interim.   SignedTruitt Merle, NP  01/24/2019 12:07 PM  [facility] [address] [place], [place]  [address] Phone: [phone] Fax: [phone]

## 2019-02-06 ENCOUNTER — Other Ambulatory Visit: Payer: Self-pay | Admitting: Urology

## 2019-02-21 NOTE — Progress Notes (Signed)
Preop clearance- 01/24/19-cardiology- office visit note  01/24/19-ekg-epic  Stress- 08/11/18-epic  CT chest- 08/11/18-epic

## 2019-02-22 NOTE — Patient Instructions (Addendum)
Aaron King  02/22/2019   Your procedure is scheduled on: 03/06/2019   Report to [facility], Report to Short Stay at   5:30 AM    Call this number if you have problems the morning of surgery [phone]     Remember: Do not eat food or drink liquids :After Midnight.  This includes no water, candy, gum, mints.  BRUSH YOUR TEETH MORNING OF SURGERY AND RINSE YOUR MOUTH OUT        Take these medicines the morning of surgery with A SIP OF WATER: Albuterol Inhaler if needed, Bring Inhaler with you.  , Metoprolol (Toprol),  Tamsulosin (Flomax )                                You may not have any metal on your body including piercings              Do not wear jewelry, , powders or perfumes, deodorant            .              Men may shave face and neck.     Do not bring valuables to the hospital. [place].  Contacts, dentures or bridgework may not be worn into surgery.       Patients discharged the day of surgery will not be allowed to drive home. IF YOU ARE HAVING SURGERY AND GOING HOME THE SAME DAY, YOU MUST HAVE AN ADULT TO DRIVE YOU HOME AND BE WITH YOU FOR 24 HOURS. YOU MAY GO HOME BY TAXI OR UBER OR ORTHERWISE, BUT AN ADULT MUST ACCOMPANY YOU HOME AND STAY WITH YOU FOR 24 HOURS.   Name and phone number of your driver:    Special Instructions: coughing and deep breathing exercises, leg exercises    _____________________________________________________________________             [facility] - Preparing for Surgery Before surgery, you can play an important role.  Because skin is not sterile, your skin needs to be as free of germs as possible.  You can reduce the number of germs on your skin by washing with CHG (chlorahexidine gluconate) soap before surgery.  CHG is an antiseptic cleaner which kills germs and bonds with the skin to continue killing germs even after washing. Please DO NOT  use if you have an allergy to CHG or antibacterial soaps.  If your skin becomes reddened/irritated stop using the CHG and inform your nurse when you arrive at Short Stay. Do not shave (including legs and underarms) for at least 48 hours prior to the first CHG shower.  You may shave your face/neck. Please follow these instructions carefully:  1.  Shower with CHG Soap the night before surgery and the  morning of Surgery.  2.  If you choose to wash your hair, wash your hair first as usual with your  normal  shampoo.  3.  After you shampoo, rinse your hair and body thoroughly to remove the  shampoo.                           4.  Use CHG as you would any  other liquid soap.  You can apply chg directly  to the skin and wash                       Gently with a scrungie or clean washcloth.  5.  Apply the CHG Soap to your body ONLY FROM THE NECK DOWN.   Do not use on face/ open                           Wound or open sores. Avoid contact with eyes, ears mouth and genitals (private parts).                       Wash face,  Genitals (private parts) with your normal soap.             6.  Wash thoroughly, paying special attention to the area where your surgery  will be performed.  7.  Thoroughly rinse your body with warm water from the neck down.  8.  DO NOT shower/wash with your normal soap after using and rinsing off  the CHG Soap.             9.  Pat yourself dry with a clean towel.            10.  Wear clean pajamas.            11.  Place clean sheets on your bed the night of your first shower and do not  sleep with pets. Day of Surgery : Do not apply any lotions/deodorants the morning of surgery.  Please wear clean clothes to the hospital/surgery center.  FAILURE TO FOLLOW THESE INSTRUCTIONS MAY RESULT IN THE CANCELLATION OF YOUR SURGERY PATIENT SIGNATURE_________________________________  NURSE  SIGNATURE__________________________________  ________________________________________________________________________

## 2019-02-23 ENCOUNTER — Other Ambulatory Visit: Payer: Self-pay

## 2019-02-23 ENCOUNTER — Encounter (HOSPITAL_COMMUNITY)
Admission: RE | Admit: 2019-02-23 | Discharge: 2019-02-23 | Disposition: A | Discharge status: Home / Self Care | Payer: Medicare Other | Source: Ambulatory Visit | Attending: Urology | Admitting: Urology

## 2019-02-23 ENCOUNTER — Encounter (HOSPITAL_COMMUNITY): Payer: Self-pay

## 2019-02-23 ENCOUNTER — Encounter (HOSPITAL_COMMUNITY): Admission: RE | Admit: 2019-02-23 | Payer: Medicare Other | Source: Ambulatory Visit

## 2019-02-23 DIAGNOSIS — Z01812 Encounter for preprocedural laboratory examination: Secondary | ICD-10-CM | POA: Diagnosis not present

## 2019-02-23 HISTORY — DX: Dyspnea, unspecified: R06.00

## 2019-02-23 HISTORY — DX: Emphysema, unspecified: J43.9

## 2019-02-23 HISTORY — DX: Unspecified urethral stricture, male, unspecified site: N35.919

## 2019-02-23 HISTORY — DX: Nocturia: R35.1

## 2019-02-23 HISTORY — DX: Gross hematuria: R31.0

## 2019-02-23 HISTORY — DX: Other forms of dyspnea: R06.09

## 2019-02-23 HISTORY — DX: Long term (current) use of anticoagulants: Z79.01

## 2019-02-23 HISTORY — DX: Old myocardial infarction: I25.2

## 2019-02-23 LAB — BASIC METABOLIC PANEL
Anion gap: 12 (ref 5–15)
BUN: 11 mg/dL (ref 8–23)
CO2: 27 mmol/L (ref 22–32)
Calcium: 9.3 mg/dL (ref 8.9–10.3)
Chloride: 105 mmol/L (ref 98–111)
Creatinine, Ser: 0.9 mg/dL (ref 0.61–1.24)
GFR calc Af Amer: >60 mL/min (ref >60)
GFR calc non Af Amer: >60 mL/min (ref >60)
Glucose, Bld: 108 mg/dL — ABNORMAL HIGH (ref 70–99)
Potassium: 4.4 mmol/L (ref 3.5–5.1)
Sodium: 144 mmol/L (ref 135–145)

## 2019-02-23 LAB — CBC
HCT: 40 % (ref 39.0–52.0)
Hemoglobin: 13.6 g/dL (ref 13.0–17.0)
MCH: 34.9 pg — ABNORMAL HIGH (ref 26.0–34.0)
MCHC: 34 g/dL (ref 30.0–36.0)
MCV: 102.6 fL — ABNORMAL HIGH (ref 80.0–100.0)
Platelets: 293 10*3/uL (ref 150–400)
RBC: 3.9 MIL/uL — ABNORMAL LOW (ref 4.22–5.81)
RDW: 13.2 % (ref 11.5–15.5)
WBC: 6.2 10*3/uL (ref 4.0–10.5)
nRBC: 0 % (ref 0.0–0.2)

## 2019-02-23 NOTE — Progress Notes (Addendum)
Pt PAT appointment today at [facility].  Pt verbalized understanding his surgery facility has changed to [facility] main OR and arrive at short stay at 0530.    Pt takes plavix,  Pt verbalized that he was given instructions from cardiologist when to stop plavix.  Pt states his last dose would be 02-27-2019.  Chart to anesthesia for review, Konrad Felix PA.   SPOKE W/  _ pt face to face at PAT apppointment     [place] 19:   COUGH--  NO  RUNNY NOSE--- NO  SORE THROAT--- NO  NASAL CONGESTION---- NO  SNEEZING---- No  SHORTNESS OF BREATH--- NO  DIFFICULTY BREATHING--- NO  TEMP >100.4----- NO  UNEXPLAINED BODY ACHES------ NO   HAVE YOU OR ANY FAMILY MEMBER TRAVELLED PAST 14 DAYS OUT OF THE   COUNTY--- NO STATE---- NO COUNTRY---- NO  HAVE YOU OR ANY FAMILY MEMBER BEEN EXPOSED TO ANYONE WITH COVID 19?  NO

## 2019-03-06 ENCOUNTER — Ambulatory Visit (HOSPITAL_COMMUNITY)
Admission: RE | Admit: 2019-03-06 | Discharge: 2019-03-06 | Disposition: A | Discharge status: Home / Self Care | Payer: Medicare Other | Attending: Urology | Admitting: Urology

## 2019-03-06 ENCOUNTER — Encounter (HOSPITAL_COMMUNITY)
Admission: RE | Disposition: A | Discharge status: Home / Self Care | Payer: Self-pay | Source: Home / Self Care | Attending: Urology

## 2019-03-06 ENCOUNTER — Ambulatory Visit (HOSPITAL_COMMUNITY): Payer: Medicare Other | Admitting: Vascular Surgery

## 2019-03-06 ENCOUNTER — Encounter (HOSPITAL_COMMUNITY): Payer: Self-pay | Admitting: Emergency Medicine

## 2019-03-06 ENCOUNTER — Ambulatory Visit (HOSPITAL_COMMUNITY): Payer: Medicare Other | Admitting: Certified Registered Nurse Anesthetist

## 2019-03-06 ENCOUNTER — Ambulatory Visit (HOSPITAL_COMMUNITY): Payer: Medicare Other

## 2019-03-06 ENCOUNTER — Other Ambulatory Visit: Payer: Self-pay

## 2019-03-06 DIAGNOSIS — R310 Gross hematuria: Secondary | ICD-10-CM | POA: Insufficient documentation

## 2019-03-06 DIAGNOSIS — Z8546 Personal history of malignant neoplasm of prostate: Secondary | ICD-10-CM | POA: Insufficient documentation

## 2019-03-06 DIAGNOSIS — I2510 Atherosclerotic heart disease of native coronary artery without angina pectoris: Secondary | ICD-10-CM | POA: Diagnosis not present

## 2019-03-06 DIAGNOSIS — Z79899 Other long term (current) drug therapy: Secondary | ICD-10-CM | POA: Insufficient documentation

## 2019-03-06 DIAGNOSIS — Z87891 Personal history of nicotine dependence: Secondary | ICD-10-CM | POA: Diagnosis not present

## 2019-03-06 DIAGNOSIS — C61 Malignant neoplasm of prostate: Secondary | ICD-10-CM | POA: Diagnosis not present

## 2019-03-06 DIAGNOSIS — Z955 Presence of coronary angioplasty implant and graft: Secondary | ICD-10-CM | POA: Insufficient documentation

## 2019-03-06 DIAGNOSIS — N35912 Unspecified bulbous urethral stricture, male: Secondary | ICD-10-CM | POA: Diagnosis not present

## 2019-03-06 DIAGNOSIS — J439 Emphysema, unspecified: Secondary | ICD-10-CM | POA: Diagnosis not present

## 2019-03-06 DIAGNOSIS — N35012 Post-traumatic membranous urethral stricture: Secondary | ICD-10-CM | POA: Diagnosis not present

## 2019-03-06 DIAGNOSIS — N35919 Unspecified urethral stricture, male, unspecified site: Secondary | ICD-10-CM | POA: Diagnosis not present

## 2019-03-06 DIAGNOSIS — Z7982 Long term (current) use of aspirin: Secondary | ICD-10-CM | POA: Insufficient documentation

## 2019-03-06 HISTORY — PX: CYSTOSCOPY WITH URETHRAL DILATATION: SHX5125

## 2019-03-06 SURGERY — CYSTOSCOPY, WITH URETHRAL DILATION
Anesthesia: General | Site: Urethra

## 2019-03-06 MED ORDER — FENTANYL CITRATE (PF) 100 MCG/2ML IJ SOLN
INTRAMUSCULAR | Status: DC | PRN
  Administered 2019-03-06: 50 ug via INTRAVENOUS

## 2019-03-06 MED ORDER — ONDANSETRON HCL 4 MG/2ML IJ SOLN
INTRAMUSCULAR | Status: DC | PRN
  Administered 2019-03-06: 4 mg via INTRAVENOUS

## 2019-03-06 MED ORDER — SUCCINYLCHOLINE CHLORIDE 200 MG/10ML IV SOSY
PREFILLED_SYRINGE | INTRAVENOUS | Status: DC | PRN
  Administered 2019-03-06: 120 mg via INTRAVENOUS

## 2019-03-06 MED ORDER — LACTATED RINGERS IV SOLN
INTRAVENOUS | Status: DC
  Administered 2019-03-06: 07:00:00 via INTRAVENOUS

## 2019-03-06 MED ORDER — FENTANYL CITRATE (PF) 100 MCG/2ML IJ SOLN
INTRAMUSCULAR | Status: AC
  Filled 2019-03-06: qty 2

## 2019-03-06 MED ORDER — SODIUM CHLORIDE 0.9 % IV SOLN
INTRAVENOUS | Status: DC | PRN
  Administered 2019-03-06: 08:00:00 90 mL

## 2019-03-06 MED ORDER — CEFAZOLIN SODIUM-DEXTROSE 2-4 GM/100ML-% IV SOLN
2.0000 g | Freq: Once | INTRAVENOUS | Status: AC
  Administered 2019-03-06: 2 g via INTRAVENOUS
  Filled 2019-03-06: qty 100

## 2019-03-06 MED ORDER — 0.9 % SODIUM CHLORIDE (POUR BTL) OPTIME
TOPICAL | Status: DC | PRN
  Administered 2019-03-06: 08:00:00 1000 mL

## 2019-03-06 MED ORDER — PROPOFOL 10 MG/ML IV BOLUS
INTRAVENOUS | Status: DC | PRN
  Administered 2019-03-06: 100 mg via INTRAVENOUS

## 2019-03-06 MED ORDER — FENTANYL CITRATE (PF) 100 MCG/2ML IJ SOLN: 25.0000 ug | INTRAMUSCULAR | Status: DC | PRN

## 2019-03-06 MED ORDER — STERILE WATER FOR IRRIGATION IR SOLN
Status: DC | PRN
  Administered 2019-03-06: 3000 mL

## 2019-03-06 MED ORDER — CEPHALEXIN 500 MG PO CAPS: 500.0000 mg | ORAL_CAPSULE | Freq: Every day | ORAL | 0 refills | Status: AC

## 2019-03-06 SURGICAL SUPPLY — 19 items
BAG URINE DRAINAGE (UROLOGICAL SUPPLIES) ×3 IMPLANT
BALLN NEPHROSTOMY (BALLOONS)
BALLOON NEPHROSTOMY (BALLOONS) IMPLANT
CATH FOLEY 2W COUNCIL 20FR 5CC (CATHETERS) IMPLANT
CATH FOLEY 2W COUNCIL 5CC 16FR (CATHETERS) ×3 IMPLANT
CATH INTERMIT  6FR 70CM (CATHETERS) IMPLANT
CLOTH BEACON ORANGE TIMEOUT ST (SAFETY) ×3 IMPLANT
COVER WAND RF STERILE (DRAPES) IMPLANT
GLOVE BIO SURGEON STRL SZ7.5 (GLOVE) ×3 IMPLANT
GOWN STRL REUS W/TWL XL LVL3 (GOWN DISPOSABLE) ×3 IMPLANT
GUIDEWIRE ANG ZIPWIRE 038X150 (WIRE) IMPLANT
GUIDEWIRE STR DUAL SENSOR (WIRE) ×3 IMPLANT
KIT BALLN UROMAX 15FX4 (MISCELLANEOUS) ×1 IMPLANT
KIT BALLN UROMAX 26 75X4 (MISCELLANEOUS) ×2
KIT TURNOVER KIT A (KITS) IMPLANT
MANIFOLD NEPTUNE II (INSTRUMENTS) IMPLANT
NS IRRIG 1000ML POUR BTL (IV SOLUTION) IMPLANT
PACK CYSTO (CUSTOM PROCEDURE TRAY) ×3 IMPLANT
WATER STERILE IRR 3000ML UROMA (IV SOLUTION) ×3 IMPLANT

## 2019-03-06 NOTE — Op Note (Signed)
Preoperative diagnosis: Urethral stricture, gross hematuria Postoperative diagnosis: Same  Procedure: Cystoscopy with urethral stricture dilation, Foley catheter placement  Surgeon: Junious Silk  Anesthesia: General  Indication for procedure: [name] is a 73 year old male with lower urinary tract symptoms and gross hematuria.  He did not tolerate office cystoscopy or dilation and was brought today for urethral stricture dilation, cystoscopy and Foley catheter placement.  We discussed possible bladder biopsy.  Findings: On exam the penis was circumcised and without mass or lesion.  The scrotum appeared normal without mass or lesion.  On cystoscopy the urethra was normal down to the proximal bulbar/membranous urethra where there was a tight stricture.  I tried to shoot a retrograde urethrogram but with the supine positioning and he was far down on the end of the bed with some interference from a metal strip did not get a good image.  The penile urethra did appear of normal caliber and the prostatic urethra appeared dilated as contrast made its way into the bladder. On cystoscopy the stricture appeared to be about a centimeter in length.  The prostatic urethra and bladder neck were patent and somewhat dilated even.  The bladder appeared normal without stone or foreign body.  There were no mucosal lesions or tumors.  Description of procedure: After consent was obtained patient brought to the operating room.  After adequate anesthesia he is placed in lithotomy position and prepped and draped in the usual sterile fashion.  A timeout was performed to confirm the patient and procedure.  The cystoscope was passed per urethra where the stricture was encountered.  I passed a council tip catheter just into the tip of the penis and inflated the balloon with 1 to 2 mL.  Retrograde injection of contrast was performed and fluoroscopic imaging was obtained. A sensor wire was advanced through the stricture and coiled  in the bladder under fluoroscopic guidance.  A 15 French 4 cm ureteral dilating balloon was passed and dilated.  It was left inflated for 30 minutes.  I used a smaller balloon to cause less trauma.  The balloon was deflated and backed out.  The cystoscope was backed out.  I then passed the cystoscope with a 33 French sheath adjacent to the wire and work my way into the bladder.  The bladder was inspected with a 30 degree and 70 degree lens.  The bladder was normal.  The bladder neck was also normal without mucosal lesion or tumor.  The scope was then backed out.  I then slid a 23 [place] council tip catheter over the wire and positioned it under fluoroscopic guidance.  The balloon was inflated and it was seated at the bladder neck under fluoroscopic guidance.  A 10 cc injection of contrast outlined the balloon at the bladder neck and the bladder.  He was awakened and taken to the cover room in stable condition.  Complications: None  Blood loss: Minimal  Specimens: None  Drains: 16 French council tip catheter  Disposition: Patient stable to PACU

## 2019-03-06 NOTE — Anesthesia Procedure Notes (Signed)
Procedure Name: Intubation Date/Time: 03/06/2019 7:42 AM Performed by: [place] Raygan Skarda C, CRNA Pre-anesthesia Checklist: Patient identified, Emergency Drugs available, Suction available and Patient being monitored Patient Re-evaluated:Patient Re-evaluated prior to induction Oxygen Delivery Method: Circle system utilized Preoxygenation: Pre-oxygenation with 100% oxygen Induction Type: IV induction Ventilation: Mask ventilation without difficulty Laryngoscope Size: Mac and 4 Grade View: Grade I Tube type: Oral Tube size: 7.5 mm Number of attempts: 1 Airway Equipment and Method: Stylet and Oral airway Placement Confirmation: ETT inserted through vocal cords under direct vision,  positive ETCO2 and breath sounds checked- equal and bilateral Secured at: 22 cm Tube secured with: Tape Dental Injury: Teeth and Oropharynx as per pre-operative assessment

## 2019-03-06 NOTE — Anesthesia Preprocedure Evaluation (Addendum)
Anesthesia Evaluation  Patient identified by MRN, date of birth, ID band Patient awake    Reviewed: Allergy & Precautions, NPO status , Patient's Chart, lab work & pertinent test results  Airway Mallampati: I  TM Distance: >3 FB Neck ROM: Full    Dental  (+) Teeth Intact, Dental Advisory Given   Pulmonary COPD,  COPD inhaler, former smoker,    breath sounds clear to auscultation       Cardiovascular hypertension, Pt. on medications and Pt. on home beta blockers + CAD and + Cardiac Stents   Rhythm:Regular Rate:Normal     Neuro/Psych    GI/Hepatic negative GI ROS, Neg liver ROS,   Endo/Other  negative endocrine ROS  Renal/GU negative Renal ROS     Musculoskeletal negative musculoskeletal ROS (+)   Abdominal (+) + obese,   Peds  Hematology negative hematology ROS (+)   Anesthesia Other Findings   Reproductive/Obstetrics                            Lab Results  Component Value Date   WBC 6.2 02/23/2019   HGB 13.6 02/23/2019   HCT 40.0 02/23/2019   MCV 102.6 (H) 02/23/2019   PLT 293 02/23/2019   Lab Results  Component Value Date   CREATININE 0.90 02/23/2019   BUN 11 02/23/2019   NA 144 02/23/2019   K 4.4 02/23/2019   CL 105 02/23/2019   CO2 27 02/23/2019   Lab Results  Component Value Date   INR 1.01 05/14/2010   INR 1.0 03/24/2009   EKG: normal sinus rhythm.  Perfusion Scan:  The left ventricular ejection fraction is hyperdynamic (>65%).  Nuclear stress EF: 67%.  There was no ST segment deviation noted during stress.  The study is normal.  This is a low risk study.  Anesthesia Physical Anesthesia Plan  ASA: III  Anesthesia Plan: General   Post-op Pain Management:    Induction: Intravenous  PONV Risk Score and Plan: 3 and Ondansetron, Dexamethasone and Treatment may vary due to age or medical condition  Airway Management Planned: Oral ETT  Additional  Equipment: None  Intra-op Plan:   Post-operative Plan: Extubation in OR  Informed Consent: I have reviewed the patients History and Physical, chart, labs and discussed the procedure including the risks, benefits and alternatives for the proposed anesthesia with the patient or authorized representative who has indicated his/her understanding and acceptance.     Dental advisory given  Plan Discussed with: CRNA  Anesthesia Plan Comments:        Anesthesia Quick Evaluation

## 2019-03-06 NOTE — Transfer of Care (Signed)
Immediate Anesthesia Transfer of Care Note  Patient: Aaron King  Procedure(s) Performed: CYSTOSCOPY WITH URETHRAL STRICTURE DILATATION, FOLEY CATHETER PLACEMENT (N/A Urethra)  Patient Location: PACU  Anesthesia Type:General  Level of Consciousness: awake, alert , oriented and patient cooperative  Airway & Oxygen Therapy: Patient Spontanous Breathing and Patient connected to face mask oxygen  Post-op Assessment: Report given to RN, Post -op Vital signs reviewed and stable and Patient moving all extremities  Post vital signs: Reviewed and stable  Last Vitals:  Vitals Value Taken Time  BP    Temp    Pulse 76 03/06/2019  8:27 AM  Resp 13 03/06/2019  8:27 AM  SpO2 100 % 03/06/2019  8:27 AM  Vitals shown include unvalidated device data.  Last Pain:  Vitals:   03/06/19 0626  TempSrc:   PainSc: 0-No pain      Patients Stated Pain Goal: 4 (31/42/76 7011)  Complications: No apparent anesthesia complications

## 2019-03-06 NOTE — Anesthesia Postprocedure Evaluation (Signed)
Anesthesia Post Note  Patient: Aaron King  Procedure(s) Performed: CYSTOSCOPY WITH URETHRAL STRICTURE DILATATION, FOLEY CATHETER PLACEMENT (N/A Urethra)     Patient location during evaluation: PACU Anesthesia Type: General Level of consciousness: awake and alert Pain management: pain level controlled Vital Signs Assessment: post-procedure vital signs reviewed and stable Respiratory status: spontaneous breathing, nonlabored ventilation, respiratory function stable and patient connected to nasal cannula oxygen Cardiovascular status: blood pressure returned to baseline and stable Postop Assessment: no apparent nausea or vomiting Anesthetic complications: no    Last Vitals:  Vitals:   03/06/19 0845 03/06/19 0945  BP: 136/79 136/79  Pulse: 61 61  Resp: (!) 22 15  Temp: 36.7 C 36.7 C  SpO2: 99% 99%    Last Pain:  Vitals:   03/06/19 0845  TempSrc:   PainSc: 0-No pain                 Effie Berkshire

## 2019-03-06 NOTE — H&P (Signed)
H&P  Chief Complaint: Urethral stricture, gross hematuria  History of Present Illness: [name] is a 73 year old white male who had a history of gross hematuria.  Ultrasound in the office was negative and office cystoscopy revealed a bulb stricture.  He did not tolerate office dilation.  He is brought today for dilation and inspection of the bladder with possible biopsy.  Since the office visit his stream has improved but he still has a weak stream and does not feel like he empties his bladder.  He has urgency especially with running water and has to run off to the bathroom.  Dysuria resolved after office dilation.  He has had no fever.  He has a history of prostate cancer and brachytherapy.  His last PSA was undetectable.  Past Medical History:  Diagnosis Date  . Anticoagulant long-term use    plavix  . CAD (coronary artery disease) cardiologist--- dr dalton mclean   a. 05/2010 NSTEMI/Cath/PCI: LM 20, LAD min irregs, LCX min irregs, OM1 99(2.5x12 Promus DES), RCA 30p, EF 65%;  b. effient d/c 2/2 to gum bleeding and epistaxis  . COPD with emphysema ([place])   . DOE (dyspnea on exertion)   . Dyslipidemia   . ETOH abuse   . Gross hematuria   . History of non-ST elevation myocardial infarction (NSTEMI) 05/2010   s/p  PCI and DES x1  . History of prostate cancer    s/p XRT and seed implantation in May 2010  . Hypertension   . Nocturia   . S/P drug eluting coronary stent placement 05/2010  . 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 APPENDECTOMY  1960  . COLONOSCOPY WITH PROPOFOL  last one 2017  . CORONARY ANGIOPLASTY WITH STENT PLACEMENT  05-15-2010  DR CRENSHAW   PCI W/ DRUG-ELUTING STENT X1 IN THE FIRST OBTUSE MARGINAL BRANCH OF CIRCUMFLEX ARTERY/ NON-STEMI/  EF 65%  . MASS EXCISION  08/23/2012   Procedure: EXCISION MASS;  Surgeon: Leighton Ruff, MD;  Location[facility];  Service: General;  Laterality: Left;  excisional biopsy  left thigh mass  . RADIOACTIVE PROSTATE SEED IMPLANTS  03-31-2009   PROSTATE CANCER  . REPAIR RIGHT INGUINAL HERNIA  1980    Home Medications:  Medications Prior to Admission  Medication Sig Dispense Refill Last Dose  . aspirin EC 81 MG tablet Take 81 mg by mouth daily. FOR BEFORE PROCEDURE ONLY   03/05/2019 at Unknown time  . atorvastatin (LIPITOR) 40 MG tablet Take 40 mg by mouth every evening.   03/05/2019 at Unknown time  . lisinopril (PRINIVIL,ZESTRIL) 10 MG tablet Take 10 mg by mouth daily.    03/05/2019 at Unknown time  . metoprolol succinate (TOPROL-XL) 25 MG 24 hr tablet Take 25 mg by mouth every morning.   03/06/2019 at 0530  . nitroGLYCERIN (NITROSTAT) 0.4 MG SL tablet Place 1 tablet (0.4 mg total) under the tongue every 5 (five) minutes as needed. 25 tablet 12 Taking  . Tamsulosin HCl (FLOMAX) 0.4 MG CAPS Take 0.4 mg by mouth daily.    03/06/2019 at 0530  . vitamin C (ASCORBIC ACID) 500 MG tablet Take 500 mg by mouth daily.   03/05/2019 at Unknown time  . albuterol (PROVENTIL HFA;VENTOLIN HFA) 108 (90 Base) MCG/ACT inhaler Inhale 2 puffs into the lungs every 4 (four) hours as needed for wheezing or shortness of breath.   More than a month at Unknown time  . clopidogrel (PLAVIX) 75 MG  tablet TAKE 1 TABLET BY MOUTH ONCE DAILY (Patient taking differently: Take 75 mg by mouth daily. ) 90 tablet 3 02/27/2019   Allergies:  Allergies  Allergen Reactions  . Oxycodone Other (See Comments)    LETHARGIC  . Oxycontin [Oxycodone Hcl] Other (See Comments)    LETHARGIC    Family History  Problem Relation Age of Onset  . Heart attack Mother        massive  . Heart disease Mother   . Congestive Heart Failure Father   . Heart disease Father   . Prostate cancer Brother    Social History:  reports that he quit smoking about 8 years ago. His smoking use included cigarettes. He has a 60.00 pack-year smoking history. He has never used smokeless tobacco. He reports current alcohol use of about 14.0  standard drinks of alcohol per week. He reports that he does not use drugs.  ROS: A complete review of systems was performed.  All systems are negative except for pertinent findings as noted. Review of Systems  All other systems reviewed and are negative.    Physical Exam:  Vital signs in last 24 hours: Temp:  [97.7 F (36.5 C)] 97.7 F (36.5 C) (04/28 0554) Pulse Rate:  [55] 55 (04/28 0554) Resp:  [18] 18 (04/28 0554) BP: (138)/(72) 138/72 (04/28 0554) SpO2:  [98 %] 98 % (04/28 0554) Weight:  [101.7 kg] 101.7 kg (04/28 0626) General:  Alert and oriented, No acute distress HEENT: Normocephalic, atraumatic Cardiovascular: Regular rate and rhythm Lungs: Regular rate and effort Abdomen: Soft, nontender, nondistended, no abdominal masses Back: No CVA tenderness Extremities: mild bilateral LE edema Neurologic: Grossly intact   Laboratory Data:  No results found for this or any previous visit (from the past 24 hour(s)). No results found for this or any previous visit (from the past 240 hour(s)). Creatinine: No results for input(s): CREATININE in the last 168 hours.  Impression/Assessment:  Urethral stricture, gross hematuria -   Plan:  I discussed again the I discussed with the patient the nature, potential benefits, risks and alternatives to cystoscopy with urethral stricture dilation, Foley catheter placement and possible bladder biopsy, including side effects of the proposed treatment, the likelihood of the patient achieving the goals of the procedure, and any potential problems that might occur during the procedure or recuperation.  We discussed the nature of stricture recurrence as well as worsening urgency or incontinence among others.  All questions answered. Patient elects to proceed.  He would like to go home with a catheter.  He said he has taken catheters out in the past by cutting the valve stem and he knows how to do that.   Festus Aloe 03/06/2019, 7:27 AM

## 2019-03-06 NOTE — Discharge Instructions (Signed)

## 2019-03-07 ENCOUNTER — Encounter (HOSPITAL_COMMUNITY): Payer: Self-pay | Admitting: Urology

## 2019-03-30 DIAGNOSIS — I10 Essential (primary) hypertension: Secondary | ICD-10-CM | POA: Diagnosis not present

## 2019-03-30 DIAGNOSIS — L84 Corns and callosities: Secondary | ICD-10-CM | POA: Diagnosis not present

## 2019-03-30 DIAGNOSIS — R600 Localized edema: Secondary | ICD-10-CM | POA: Diagnosis not present

## 2019-03-30 DIAGNOSIS — Z6837 Body mass index (BMI) 37.0-37.9, adult: Secondary | ICD-10-CM | POA: Diagnosis not present

## 2019-03-30 DIAGNOSIS — E7800 Pure hypercholesterolemia, unspecified: Secondary | ICD-10-CM | POA: Diagnosis not present

## 2019-04-04 DIAGNOSIS — Z79899 Other long term (current) drug therapy: Secondary | ICD-10-CM | POA: Diagnosis not present

## 2019-04-04 DIAGNOSIS — R600 Localized edema: Secondary | ICD-10-CM | POA: Diagnosis not present

## 2019-04-04 DIAGNOSIS — E7800 Pure hypercholesterolemia, unspecified: Secondary | ICD-10-CM | POA: Diagnosis not present

## 2019-04-04 DIAGNOSIS — I10 Essential (primary) hypertension: Secondary | ICD-10-CM | POA: Diagnosis not present

## 2019-04-04 DIAGNOSIS — M545 Low back pain: Secondary | ICD-10-CM | POA: Diagnosis not present

## 2019-04-04 DIAGNOSIS — J449 Chronic obstructive pulmonary disease, unspecified: Secondary | ICD-10-CM | POA: Diagnosis not present

## 2019-04-05 DIAGNOSIS — N35012 Post-traumatic membranous urethral stricture: Secondary | ICD-10-CM | POA: Diagnosis not present

## 2019-04-05 DIAGNOSIS — R3912 Poor urinary stream: Secondary | ICD-10-CM | POA: Diagnosis not present

## 2019-05-14 DIAGNOSIS — H25813 Combined forms of age-related cataract, bilateral: Secondary | ICD-10-CM | POA: Diagnosis not present

## 2019-05-23 ENCOUNTER — Other Ambulatory Visit: Payer: Self-pay | Admitting: Nurse Practitioner

## 2019-07-05 DIAGNOSIS — M25552 Pain in left hip: Secondary | ICD-10-CM | POA: Diagnosis not present

## 2019-07-05 DIAGNOSIS — M25551 Pain in right hip: Secondary | ICD-10-CM | POA: Diagnosis not present

## 2019-07-05 DIAGNOSIS — Z79899 Other long term (current) drug therapy: Secondary | ICD-10-CM | POA: Diagnosis not present

## 2019-07-05 DIAGNOSIS — M545 Low back pain: Secondary | ICD-10-CM | POA: Diagnosis not present

## 2019-07-05 DIAGNOSIS — M5126 Other intervertebral disc displacement, lumbar region: Secondary | ICD-10-CM | POA: Diagnosis not present

## 2019-07-10 DIAGNOSIS — M47816 Spondylosis without myelopathy or radiculopathy, lumbar region: Secondary | ICD-10-CM | POA: Diagnosis not present

## 2019-07-10 DIAGNOSIS — M2578 Osteophyte, vertebrae: Secondary | ICD-10-CM | POA: Diagnosis not present

## 2019-07-10 DIAGNOSIS — M545 Low back pain: Secondary | ICD-10-CM | POA: Diagnosis not present

## 2019-07-10 DIAGNOSIS — M48061 Spinal stenosis, lumbar region without neurogenic claudication: Secondary | ICD-10-CM | POA: Diagnosis not present

## 2019-07-10 DIAGNOSIS — M5126 Other intervertebral disc displacement, lumbar region: Secondary | ICD-10-CM | POA: Diagnosis not present

## 2019-07-26 NOTE — Progress Notes (Signed)
CARDIOLOGY OFFICE NOTE  Date:  07/30/2019    Aaron King Date of Birth: 03/08/1946 Medical Record [id number]  PCP:  Philmore Pali, NP  Cardiologist:  Servando Snare   Chief Complaint  Patient presents with   Follow-up    History of Present Illness: Aaron King is a 73 y.o. male who presents today for a follow up visit.  Former patient of Dr. Claris Gladden.Primarily follows with me.  He has ahistory of CAD s/p NSTEMI in 7/11. Hehad PCI with DES to OM at the time of his MI. Had just single vessel disease noted at that time.He was admitted in 9/14 with mid-scapular upper back pain. He had similar pain with his MI. The pain was worse with cough and with certain arm movements. It was reproduced by palpation. It lasted for about a day total. Cardiac enzymes remained negative. The patient was thought to have noncardiac chest pain and was sent home.Other issues include obesity, prostate CA, HTN and HLD. Apparently had not been able to tolerate low dose aspirin in the past according toa prior note from Dr. Aundra Dubin.  When I saw him back inSeptember of 2018 - he was doing well. He remains on Plavix therapy solely. He had had a CT by GU - lung nodule noted - he asked me to follow which we have - this has also demonstrated extensive CAD noted as well. He has not required any additional CT of the chest since October of 2019 when clearing was noted. Last seen in March - having lots of GU issues - cardiac status ok. I cleared him for his surgery - he had had a low risk Myoview 6 months prior. We did stop his Plavix and just use aspirin for 5 days.   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   The patient does not have symptoms concerning for COVID-19 infection (fever, chills, cough, or new shortness of breath).   Comes in today. Here alone. He had his GU procedure for his bladder to help with his voiding issue. Doing fine from that. No active bleeding. He is bruising - notes a lot  from his dog jumping up on him. No chest pain. Breathing is ok. Pretty sedentary. Planning on going to [place] later a day to work on a house. BP is good. He is not dizzy or lightheaded. He feels like he is doing well. No real exercise. He is more limited by his back - has had recent MRI down in [place] - I am not able to see. He is not sure if surgery will be entertained.   Past Medical History:  Diagnosis Date   Anticoagulant long-term use    plavix   CAD (coronary artery disease) cardiologist--- dr dalton mclean   a. 05/2010 NSTEMI/Cath/PCI: LM 35, LAD min irregs, LCX min irregs, OM1 99(2.5x12 Promus DES), RCA 30p, EF 65%;  b. 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     Past Surgical History:  Procedure Laterality Date   APPENDECTOMY  1960   COLONOSCOPY WITH PROPOFOL  last one 2017   CORONARY ANGIOPLASTY WITH STENT PLACEMENT  05-15-2010  DR CRENSHAW   PCI W/ DRUG-ELUTING STENT X1 IN THE FIRST OBTUSE MARGINAL BRANCH OF CIRCUMFLEX ARTERY/ NON-STEMI/  EF 65%   CYSTOSCOPY WITH URETHRAL DILATATION N/A 03/06/2019   Procedure: CYSTOSCOPY WITH URETHRAL STRICTURE DILATATION, FOLEY CATHETER PLACEMENT;  Surgeon: Festus Aloe, MD;  Location: [facility];  Service: Urology;  Laterality: N/A;   MASS EXCISION  08/23/2012   Procedure: EXCISION MASS;  Surgeon: Leighton Ruff, MD;  Location[facility];  Service: General;  Laterality: Left;  excisional biopsy left thigh mass   RADIOACTIVE PROSTATE SEED IMPLANTS  03-31-2009   PROSTATE CANCER   REPAIR RIGHT INGUINAL HERNIA  1980      Medications: Current Meds  Medication Sig   albuterol (PROVENTIL HFA;VENTOLIN HFA) 108 (90 Base) MCG/ACT inhaler Inhale 2 puffs into the lungs every 4 (four) hours as needed for wheezing or shortness of breath.   aspirin EC 81 MG tablet Take 81 mg by mouth as needed (headache).   atorvastatin (LIPITOR) 40 MG tablet Take 40 mg by mouth every evening.   clopidogrel (PLAVIX) 75 MG tablet Take 1 tablet by mouth once daily   furosemide (LASIX) 20 MG tablet TAKE 1 TABLET BY MOUTH IN THE MORNING AS NEEDED FOR SWELLING   lisinopril (PRINIVIL,ZESTRIL) 10 MG tablet Take 10 mg by mouth daily.    metoprolol succinate (TOPROL-XL) 25 MG 24 hr tablet Take 25 mg by mouth every morning.   nitroGLYCERIN (NITROSTAT) 0.4 MG SL tablet Place 1 tablet (0.4 mg total) under the tongue every 5 (five) minutes as needed.   Tamsulosin HCl (FLOMAX) 0.4 MG CAPS Take 0.4 mg by mouth daily.    traMADol (ULTRAM) 50 MG tablet TAKE 1 TABLET BY MOUTH TWICE DAILY FOR BACK PAIN   vitamin C (ASCORBIC ACID) 500 MG tablet Take 500 mg by mouth daily.     Allergies: Allergies  Allergen Reactions   Oxycodone Other (See Comments)    LETHARGIC   Oxycontin [Oxycodone Hcl] Other (See Comments)    LETHARGIC    Social History: The patient  reports that he quit smoking about 9 years ago. His smoking use included cigarettes. He has a 60.00 pack-year smoking history. He has never used smokeless tobacco. He reports current alcohol use of about 14.0 standard drinks of alcohol per week. He reports that he does not use drugs.   Family History: The patient's family history includes Congestive Heart Failure in his father; Heart attack in his mother; Heart disease in his father and mother; Prostate cancer in his brother.   Review of Systems: Please see the history of present illness.   All other systems are reviewed and negative.   Physical Exam: VS:  Pulse 82    Ht 5\' 8"  (1.727 m)    Wt 229 lb (103.9 kg)    BMI 34.82  kg/m  .  BMI Body mass index is 34.82 kg/m.  Wt Readings from Last 3 Encounters:  07/30/19 229 lb (103.9 kg)  03/06/19 224 lb 4 oz (101.7 kg)  02/23/19 224 lb 4 oz (101.7 kg)   BP is 110/60 by me with the large cuff.   General: Pleasant. Obese. Alert and in no acute distress.   HEENT: Normal.  Neck: Supple, no JVD, carotid bruits, or masses noted.  Cardiac: Regular rate and rhythm. No murmurs, rubs, or gallops. No edema.  Respiratory:  Lungs are clear to auscultation bilaterally with normal work of breathing.  GI: Soft and nontender.  MS: No deformity or atrophy. Gait and ROM intact.  Skin: Warm and dry. Color is normal.  Neuro:  Strength and sensation are intact and no gross focal deficits noted.  Psych: Alert, appropriate and with normal affect.   LABORATORY DATA:  EKG:  EKG is not ordered today.  Lab Results  Component Value Date   WBC 6.2 02/23/2019   HGB 13.6 02/23/2019   HCT 40.0 02/23/2019   PLT 293 02/23/2019   GLUCOSE 108 (H) 02/23/2019   CHOL 132 05/26/2016   TRIG 121 05/26/2016   HDL 72 05/26/2016   LDLCALC 36 05/26/2016   ALT 12 05/14/2010   AST 32 05/14/2010   NA 144 02/23/2019   K 4.4 02/23/2019   CL 105 02/23/2019   CREATININE 0.90 02/23/2019   BUN 11 02/23/2019   CO2 27 02/23/2019   INR 1.01 05/14/2010       BNP (last 3 results) No results for input(s): BNP in the last 8760 hours.  ProBNP (last 3 results) No results for input(s): PROBNP in the last 8760 hours.   Other Studies Reviewed Today:  Myoview Study Highlights 08/2018   The left ventricular ejection fraction is hyperdynamic (>65%).  Nuclear stress EF: 67%.  There was no ST segment deviation noted during stress.  The study is normal.  This is a low risk study.     Assessment/Plan: 1. CAD -remote PCI/DES stent for single vessel OM CAD in 2011 - with known residual disease noted - he has no active chest pain. Noted extensive CAD on his last CT scan - he had a low  risk Myoview from 2019 - CV risk factor modification is always a challenge for him.   2. HLD -he is on Lipitor - labs from May noted - LDL is 52 -   3. HTN - BP looks good by me - no changes made today.   4. Prior GU surgery - history of prostate cancer - currently stable by his report.   5. Prior lung nodule - no further follow up recommended from last study - not discussed today.   6. Back pain - has had recent MRI - he wanted me to review - he will bring a copy by for me.   7. Obesity - tried to be encouraging.  8. Bruising - recheck CBC today. He was not able to tolerate high dose aspirin due to nose bleeding - he has no actual bleeding just bruising - probably exacerbated by his dog jumping on him. [place] lab today. We will continue the Plavix for now.   9. COVID-19 Education: The signs and symptoms of COVID-19 were discussed with the patient and how to seek care for testing (follow up with PCP or arrange E-visit).  The importance of social distancing, staying at home, hand hygiene and wearing a mask when out in public were discussed today.  Current medicines are reviewed with the patient today.  The patient does not have concerns regarding medicines other than what has been noted above.  The following changes have been made:  See above.  Labs/ tests ordered today include:    Orders Placed This Encounter  Procedures   Basic metabolic panel   CBC with Differential/Platelet     Disposition:   FU with me in 6 months.   Patient is agreeable to this plan and will call if any problems develop  in the interim.   SignedTruitt Merle, NP  07/30/2019 11:06 AM  [facility] [address] [place], [place]  [address] Phone: [phone] Fax: [phone]

## 2019-07-30 ENCOUNTER — Other Ambulatory Visit: Payer: Self-pay

## 2019-07-30 ENCOUNTER — Encounter: Payer: Self-pay | Admitting: Nurse Practitioner

## 2019-07-30 ENCOUNTER — Ambulatory Visit (INDEPENDENT_AMBULATORY_CARE_PROVIDER_SITE_OTHER): Payer: Medicare Other | Admitting: Nurse Practitioner

## 2019-07-30 VITALS — HR 82 | Ht 68.0 in | Wt 229.0 lb

## 2019-07-30 DIAGNOSIS — I2510 Atherosclerotic heart disease of native coronary artery without angina pectoris: Secondary | ICD-10-CM

## 2019-07-30 DIAGNOSIS — I10 Essential (primary) hypertension: Secondary | ICD-10-CM

## 2019-07-30 LAB — CBC WITH DIFFERENTIAL/PLATELET
Basophils Absolute: 0.1 10*3/uL (ref 0.0–0.2)
Basos: 1 %
EOS (ABSOLUTE): 0.2 10*3/uL (ref 0.0–0.4)
Eos: 2 %
Hematocrit: 36.3 % — ABNORMAL LOW (ref 37.5–51.0)
Hemoglobin: 12.6 g/dL — ABNORMAL LOW (ref 13.0–17.7)
Immature Grans (Abs): 0 10*3/uL (ref 0.0–0.1)
Immature Granulocytes: 0 %
Lymphocytes Absolute: 1.7 10*3/uL (ref 0.7–3.1)
Lymphs: 19 %
MCH: 35.5 pg — ABNORMAL HIGH (ref 26.6–33.0)
MCHC: 34.7 g/dL (ref 31.5–35.7)
MCV: 102 fL — ABNORMAL HIGH (ref 79–97)
Monocytes Absolute: 0.6 10*3/uL (ref 0.1–0.9)
Monocytes: 7 %
Neutrophils Absolute: 6.2 10*3/uL (ref 1.4–7.0)
Neutrophils: 71 %
Platelets: 296 10*3/uL (ref 150–450)
RBC: 3.55 x10E6/uL — ABNORMAL LOW (ref 4.14–5.80)
RDW: 11.9 % (ref 11.6–15.4)
WBC: 8.7 10*3/uL (ref 3.4–10.8)

## 2019-07-30 LAB — BASIC METABOLIC PANEL
BUN/Creatinine Ratio: 10 (ref 10–24)
BUN: 12 mg/dL (ref 8–27)
CO2: 24 mmol/L (ref 20–29)
Calcium: 9.1 mg/dL (ref 8.6–10.2)
Chloride: 101 mmol/L (ref 96–106)
Creatinine, Ser: 1.16 mg/dL (ref 0.76–1.27)
GFR calc Af Amer: 72 mL/min/{1.73_m2} (ref >59)
GFR calc non Af Amer: 63 mL/min/{1.73_m2} (ref >59)
Glucose: 101 mg/dL — ABNORMAL HIGH (ref 65–99)
Potassium: 4.2 mmol/L (ref 3.5–5.2)
Sodium: 139 mmol/L (ref 134–144)

## 2019-07-30 NOTE — Patient Instructions (Addendum)
After Visit Summary:  We will be checking the following labs today - BMET & CBC   Medication Instructions:    Continue with your current medicines.    If you need a refill on your cardiac medications before your next appointment, please call your pharmacy.     Testing/Procedures To Be Arranged:  N/A  Follow-Up:   See me in 6 months    At [facility], you and your health needs are our priority.  As part of our continuing mission to provide you with exceptional heart care, we have created designated Provider Care Teams.  These Care Teams include your primary Cardiologist (physician) and Advanced Practice Providers (APPs -  Physician Assistants and Nurse Practitioners) who all work together to provide you with the care you need, when you need it.  Special Instructions:  . Stay safe, stay home, wash your hands for at least 20 seconds and wear a mask when out in public.  . It was good to talk with you today.    Call the  [facility] office at [phone] if you have any questions, problems or concerns.       

## 2019-08-21 DIAGNOSIS — L210 Seborrhea capitis: Secondary | ICD-10-CM | POA: Diagnosis not present

## 2019-08-21 DIAGNOSIS — Z23 Encounter for immunization: Secondary | ICD-10-CM | POA: Diagnosis not present

## 2019-08-21 DIAGNOSIS — H6092 Unspecified otitis externa, left ear: Secondary | ICD-10-CM | POA: Diagnosis not present

## 2019-08-21 DIAGNOSIS — H6122 Impacted cerumen, left ear: Secondary | ICD-10-CM | POA: Diagnosis not present

## 2019-08-21 DIAGNOSIS — Z87891 Personal history of nicotine dependence: Secondary | ICD-10-CM | POA: Diagnosis not present

## 2019-08-21 DIAGNOSIS — L853 Xerosis cutis: Secondary | ICD-10-CM | POA: Diagnosis not present

## 2019-09-03 DIAGNOSIS — Z87891 Personal history of nicotine dependence: Secondary | ICD-10-CM | POA: Diagnosis not present

## 2019-09-26 ENCOUNTER — Other Ambulatory Visit: Payer: Self-pay | Admitting: Nurse Practitioner

## 2019-09-26 DIAGNOSIS — Z8546 Personal history of malignant neoplasm of prostate: Secondary | ICD-10-CM | POA: Diagnosis not present

## 2019-09-26 MED ORDER — NITROGLYCERIN 0.4 MG SL SUBL: 0.4000 mg | SUBLINGUAL_TABLET | SUBLINGUAL | 7 refills | Status: DC | PRN

## 2019-10-17 DIAGNOSIS — Z1331 Encounter for screening for depression: Secondary | ICD-10-CM | POA: Diagnosis not present

## 2019-10-17 DIAGNOSIS — M5126 Other intervertebral disc displacement, lumbar region: Secondary | ICD-10-CM | POA: Diagnosis not present

## 2019-10-17 DIAGNOSIS — E7800 Pure hypercholesterolemia, unspecified: Secondary | ICD-10-CM | POA: Diagnosis not present

## 2019-10-17 DIAGNOSIS — Z9181 History of falling: Secondary | ICD-10-CM | POA: Diagnosis not present

## 2019-10-17 DIAGNOSIS — Z79899 Other long term (current) drug therapy: Secondary | ICD-10-CM | POA: Diagnosis not present

## 2019-10-17 DIAGNOSIS — J449 Chronic obstructive pulmonary disease, unspecified: Secondary | ICD-10-CM | POA: Diagnosis not present

## 2019-10-17 DIAGNOSIS — R600 Localized edema: Secondary | ICD-10-CM | POA: Diagnosis not present

## 2019-10-17 DIAGNOSIS — I10 Essential (primary) hypertension: Secondary | ICD-10-CM | POA: Diagnosis not present

## 2019-10-19 DIAGNOSIS — E785 Hyperlipidemia, unspecified: Secondary | ICD-10-CM | POA: Diagnosis not present

## 2020-01-09 DIAGNOSIS — M1611 Unilateral primary osteoarthritis, right hip: Secondary | ICD-10-CM | POA: Diagnosis not present

## 2020-01-09 DIAGNOSIS — Z6839 Body mass index (BMI) 39.0-39.9, adult: Secondary | ICD-10-CM | POA: Diagnosis not present

## 2020-01-09 DIAGNOSIS — M545 Low back pain: Secondary | ICD-10-CM | POA: Diagnosis not present

## 2020-01-10 ENCOUNTER — Telehealth: Payer: Self-pay | Admitting: *Deleted

## 2020-01-10 NOTE — Telephone Encounter (Signed)
Patient has upcoming cardiology followup on 3/17 with Cecille Rubin. Cardiac clearance will be given during that visit. Please update visit info to include preop clearance

## 2020-01-10 NOTE — Telephone Encounter (Signed)
Pt has appt 01/23/20 with Truitt Merle, NP. I will forward clearance notes to NP for upcoming appt. I will remove from the pre op call back pool.

## 2020-01-10 NOTE — Telephone Encounter (Signed)
   [facility] Pre-operative Risk Assessment    Request for surgical clearance:  1. What type of surgery is being performed? RIGHT TOTAL HIP REPLACEMENT   2. When is this surgery scheduled? TBD   3. What type of clearance is required (medical clearance vs. Pharmacy clearance to hold med vs. Both)? MEDICAL  4. Are there any medications that need to be held prior to surgery and how long? PLAVIX, ASA   5. Practice name and name of physician performing surgery? [facility]; DR. Caprice Kluver  6. What is your office phone number [phone] (KELLY)    7.   What is your office fax number [phone]  8.   Anesthesia type (None, local, MAC, general) ? CHOICE   Julaine Hua 01/10/2020, 11:59 AM  _________________________________________________________________   (provider comments below)

## 2020-01-15 DIAGNOSIS — E7800 Pure hypercholesterolemia, unspecified: Secondary | ICD-10-CM | POA: Diagnosis not present

## 2020-01-15 DIAGNOSIS — Z79899 Other long term (current) drug therapy: Secondary | ICD-10-CM | POA: Diagnosis not present

## 2020-01-15 DIAGNOSIS — M5126 Other intervertebral disc displacement, lumbar region: Secondary | ICD-10-CM | POA: Diagnosis not present

## 2020-01-15 DIAGNOSIS — I10 Essential (primary) hypertension: Secondary | ICD-10-CM | POA: Diagnosis not present

## 2020-01-15 DIAGNOSIS — J449 Chronic obstructive pulmonary disease, unspecified: Secondary | ICD-10-CM | POA: Diagnosis not present

## 2020-01-15 NOTE — Progress Notes (Signed)
CARDIOLOGY OFFICE NOTE  Date:  01/23/2020    Aaron King Date of Birth: [birth year] Medical Record [id number]  PCP:  Philmore Pali, NP  Cardiologist:  Servando Snare   Chief Complaint  Patient presents with  . Follow-up  . Pre-op Exam    History of Present Illness: Aaron King is a 74 y.o. male who presents today for a follow up/pre op clearance visit. Former patient of Dr. Claris Gladden.Primarily follows with me.  He has ahistory of CAD s/p NSTEMI in 7/11. Hehad PCI with DES to OM at the time of his MI. Had just single vessel disease noted at that time.He was admitted in 9/14 with mid-scapular upper back pain. He had similar pain with his MI. The pain was worse with cough and with certain arm movements. It was reproduced by palpation. It lasted for about a day total. Cardiac enzymes remained negative. The patient was thought to have noncardiac chest pain and was sent home.Other issues include obesity, prostate CA, HTN and HLD. Apparently hadnot been able to tolerate low dose aspirin in the past according toa prior note from Dr. Aundra Dubin.  When I saw him back inSeptember of 2018 - he was doing well. He has remained on Plavix therapy solely. He had had a CT by GU - lung nodule noted - he asked me to follow which we have - this has also demonstrated extensive CAD noted as well. He has not required any additional CT of the chest since October of 2019 when clearing was noted. When seen back in March 2020 - having lots of GU issues - cardiac status ok. I cleared him for his surgery - he had had a low risk Myoview 6 months prior. We did stop his Plavix and used aspirin for 5 days. Last seen in September - he was doing well. More limited from his back.   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   The patient does not have symptoms concerning for COVID-19 infection (fever, chills, cough, or new shortness of breath).   Comes in today. Here alone. Wanting to have right total  hip replacement. Not scheduled yet. He has gotten more sedentary due to hip pain. He can still go up stairs - has to go slow but it hurts. [place] with [place] gas heat - had to sleep upstairs where the electric unit was - he ended up going up and down 16 steps multiple times during the course of that trip. He has no chest pain. He is only short of breath if he really overexerts himself. Not dizzy. No syncope.  He is seeing GU later today due to excessive nocturia - up 7 times a night. He rarely takes aspirin - in the past this caused nose bleeding. He is primarily only on Plavix as monotherapy.   Past Medical History:  Diagnosis Date  . Anticoagulant long-term use    plavix  . CAD (coronary artery disease) cardiologist--- dr dalton mclean   a. 05/2010 NSTEMI/Cath/PCI: LM 20, LAD min irregs, LCX min irregs, OM1 99(2.5x12 Promus DES), RCA 30p, EF 65%;  b. effient d/c 2/2 to gum bleeding and epistaxis  . COPD with emphysema ([place])   . DOE (dyspnea on exertion)   . Dyslipidemia   . ETOH abuse   . Gross hematuria   . History of non-ST elevation myocardial infarction (NSTEMI) 05/2010   s/p  PCI and DES  x1  . History of prostate cancer    s/p XRT and seed implantation in May 2010  . Hypertension   . Nocturia   . S/P drug eluting coronary stent placement 05/2010  . 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 APPENDECTOMY  1960  . COLONOSCOPY WITH PROPOFOL  last one 2017  . CORONARY ANGIOPLASTY WITH STENT PLACEMENT  05-15-2010  DR CRENSHAW   PCI W/ DRUG-ELUTING STENT X1 IN THE FIRST OBTUSE MARGINAL BRANCH OF CIRCUMFLEX ARTERY/ NON-STEMI/  EF 65%  . CYSTOSCOPY WITH URETHRAL DILATATION N/A 03/06/2019   Procedure: CYSTOSCOPY WITH URETHRAL STRICTURE DILATATION, FOLEY CATHETER PLACEMENT;  Surgeon: Festus Aloe, MD;  Location: [facility];  Service: Urology;  Laterality: N/A;  . MASS EXCISION  08/23/2012   Procedure:  EXCISION MASS;  Surgeon: Leighton Ruff, MD;  [facility];  Service: General;  Laterality: Left;  excisional biopsy left thigh mass  . RADIOACTIVE PROSTATE SEED IMPLANTS  03-31-2009   PROSTATE CANCER  . REPAIR RIGHT INGUINAL HERNIA  1980     Medications: Current Meds  Medication Sig  . albuterol (PROVENTIL HFA;VENTOLIN HFA) 108 (90 Base) MCG/ACT inhaler Inhale 2 puffs into the lungs every 4 (four) hours as needed for wheezing or shortness of breath.  Marland Kitchen aspirin EC 81 MG tablet Take 81 mg by mouth as needed (headache).  . clopidogrel (PLAVIX) 75 MG tablet Take 1 tablet by mouth once daily  . furosemide (LASIX) 20 MG tablet TAKE 1 TABLET BY MOUTH IN THE MORNING AS NEEDED FOR SWELLING  . HYDROcodone-acetaminophen (NORCO) 7.5-325 MG tablet Take 1 tablet by mouth 5 (five) times daily as needed.  Marland Kitchen lisinopril (PRINIVIL,ZESTRIL) 10 MG tablet Take 10 mg by mouth daily.   Marland Kitchen lovastatin (MEVACOR) 40 MG tablet Take 40 mg by mouth daily.  . metoprolol succinate (TOPROL-XL) 25 MG 24 hr tablet Take 25 mg by mouth every morning.  . nitroGLYCERIN (NITROSTAT) 0.4 MG SL tablet Place 1 tablet (0.4 mg total) under the tongue every 5 (five) minutes as needed.  . Tamsulosin HCl (FLOMAX) 0.4 MG CAPS Take 0.4 mg by mouth daily.   . traMADol (ULTRAM) 50 MG tablet TAKE 1 TABLET BY MOUTH TWICE DAILY FOR BACK PAIN  . vitamin C (ASCORBIC ACID) 500 MG tablet Take 500 mg by mouth daily.  . [DISCONTINUED] atorvastatin (LIPITOR) 40 MG tablet Take 40 mg by mouth every evening.     Allergies: Allergies  Allergen Reactions  . Oxycodone Other (See Comments)    LETHARGIC  . Oxycontin [Oxycodone Hcl] Other (See Comments)    LETHARGIC    Social History: The patient  reports that he quit smoking about 9 years ago. His smoking use included cigarettes. He has a 60.00 pack-year smoking history. He has never used smokeless tobacco. He reports current alcohol use of about 14.0 standard drinks of alcohol  per week. He reports that he does not use drugs.   Family History: The patient's family history includes Congestive Heart Failure in his father; Heart attack in his mother; Heart disease in his father and mother; Prostate cancer in his brother.   Review of Systems: Please see the history of present illness.   All other systems are reviewed and negative.   Physical Exam: VS:  BP 122/68   Pulse 68   Ht 5' 8.5" (1.74 m)   Wt 230 lb 12.8 oz (104.7 kg)   BMI 34.58 kg/m  .  BMI  Body mass index is 34.58 kg/m.  Wt Readings from Last 3 Encounters:  01/23/20 230 lb 12.8 oz (104.7 kg)  07/30/19 229 lb (103.9 kg)  03/06/19 224 lb 4 oz (101.7 kg)    General: Alert and in no acute distress. He remains obese.  Cardiac: Regular rate and rhythm. No murmurs, rubs, or gallops. No edema.  Respiratory:  Lungs are clear to auscultation bilaterally with normal work of breathing.  GI: Soft and nontender.  MS: No deformity or atrophy. Gait and ROM intact.  Skin: Warm and dry. Color is normal.  Neuro:  Strength and sensation are intact and no gross focal deficits noted.  Psych: Alert, appropriate and with normal affect.   LABORATORY DATA:  EKG:  EKG is ordered today. This demonstrates sinus rhythm. HR is 68 - there are non specific ST changes - unchanged from prior tracing. Personally reviewed by me.  Lab Results  Component Value Date   WBC 8.7 07/30/2019   HGB 12.6 (L) 07/30/2019   HCT 36.3 (L) 07/30/2019   PLT 296 07/30/2019   GLUCOSE 101 (H) 07/30/2019   CHOL 132 05/26/2016   TRIG 121 05/26/2016   HDL 72 05/26/2016   LDLCALC 36 05/26/2016   ALT 12 05/14/2010   AST 32 05/14/2010   NA 139 07/30/2019   K 4.2 07/30/2019   CL 101 07/30/2019   CREATININE 1.16 07/30/2019   BUN 12 07/30/2019   CO2 24 07/30/2019   INR 1.01 05/14/2010       BNP (last 3 results) No results for input(s): BNP in the last 8760 hours.  ProBNP (last 3 results) No results for input(s): PROBNP in the last  8760 hours.   Other Studies Reviewed Today:  MyoviewStudy Highlights10/2019   The left ventricular ejection fraction is hyperdynamic (>65%).  Nuclear stress EF: 67%.  There was no ST segment deviation noted during stress.  The study is normal.  This is a low risk study.     Assessment/Plan:  1. Pre op clearance - wanting hip replacement - not scheduled yet - low risk Myoview from 08/2018 - he has no active symptoms - he can complete 4 mets of activity - he is clearly becoming limited due to chronic pain. He is felt to be an acceptable candidate - although will be at some increased risk given his known cardiac issues - will hold Plavix for 5 days prior to hip surgery, use baby aspirin during these 5 days - restart Plavix as soon as deemed ok by orthopedics. Will be available as needed.   2. Known CAD with remote PCI/DES for single vessel CAD from 2011 - known residual disease - extensive on prior CT - low risk Myoview from 2019 - getting limited with ability for CV risk factor modification due to hip pain. Fortunately, no worrisome symptoms.   3. HTN - BP is good. No changes made today.   4. HLD - on statin - he had labs by PCP back in December.   5. Obesity - he knows what he needs to be doing - hopefully after his surgery he will be able to be more active.   6. COVID-19 Education: The signs and symptoms of COVID-19 were discussed with the patient and how to seek care for testing (follow up with PCP or arrange E-visit).  The importance of social distancing, staying at home, hand hygiene and wearing a mask when out in public were discussed today. He has had both COVID vaccines.   Current  medicines are reviewed with the patient today.  The patient does not have concerns regarding medicines other than what has been noted above.  The following changes have been made:  See above.  Labs/ tests ordered today include:    Orders Placed This Encounter  Procedures  . EKG  12-Lead     Disposition:   FU with me in 6 months.   Patient is agreeable to this plan and will call if any problems develop in the interim.   SignedTruitt Merle, NP  01/23/2020 10:41 AM  [facility] [address] [place], [place]  [address] Phone: [phone] Fax: [phone]

## 2020-01-16 DIAGNOSIS — M1611 Unilateral primary osteoarthritis, right hip: Secondary | ICD-10-CM | POA: Diagnosis not present

## 2020-01-23 ENCOUNTER — Encounter: Payer: Self-pay | Admitting: Nurse Practitioner

## 2020-01-23 ENCOUNTER — Ambulatory Visit (INDEPENDENT_AMBULATORY_CARE_PROVIDER_SITE_OTHER): Payer: Medicare Other | Admitting: Nurse Practitioner

## 2020-01-23 ENCOUNTER — Other Ambulatory Visit: Payer: Self-pay

## 2020-01-23 VITALS — BP 122/68 | HR 68 | Ht 68.5 in | Wt 230.8 lb

## 2020-01-23 DIAGNOSIS — E7800 Pure hypercholesterolemia, unspecified: Secondary | ICD-10-CM

## 2020-01-23 DIAGNOSIS — Z01810 Encounter for preprocedural cardiovascular examination: Secondary | ICD-10-CM | POA: Diagnosis not present

## 2020-01-23 DIAGNOSIS — I10 Essential (primary) hypertension: Secondary | ICD-10-CM | POA: Diagnosis not present

## 2020-01-23 DIAGNOSIS — I2510 Atherosclerotic heart disease of native coronary artery without angina pectoris: Secondary | ICD-10-CM

## 2020-01-23 NOTE — Patient Instructions (Addendum)
After Visit Summary:  We will be checking the following labs today - NONE   Medication Instructions:    Continue with your current medicines.    If you need a refill on your cardiac medications before your next appointment, please call your pharmacy.     Testing/Procedures To Be Arranged:  N/A  Follow-Up:   See me in 6 months    At [facility], you and your health needs are our priority.  As part of our continuing mission to provide you with exceptional heart care, we have created designated Provider Care Teams.  These Care Teams include your primary Cardiologist (physician) and Advanced Practice Providers (APPs -  Physician Assistants and Nurse Practitioners) who all work together to provide you with the care you need, when you need it.  Special Instructions:  . Stay safe, stay home, wash your hands for at least 20 seconds and wear a mask when out in public.  . It was good to talk with you today.  . I will send a note about your upcoming hip surgery. We will plan to hold the Plavix for 5 days prior to your hip surgery and just use baby aspirin during that time.    Call the [place] office at [phone] if you have any questions, problems or concerns.

## 2020-03-24 DIAGNOSIS — Z8546 Personal history of malignant neoplasm of prostate: Secondary | ICD-10-CM | POA: Diagnosis not present

## 2020-03-24 DIAGNOSIS — R3121 Asymptomatic microscopic hematuria: Secondary | ICD-10-CM | POA: Diagnosis not present

## 2020-03-24 DIAGNOSIS — N401 Enlarged prostate with lower urinary tract symptoms: Secondary | ICD-10-CM | POA: Diagnosis not present

## 2020-03-24 DIAGNOSIS — N35012 Post-traumatic membranous urethral stricture: Secondary | ICD-10-CM | POA: Diagnosis not present

## 2020-04-09 DIAGNOSIS — Z8546 Personal history of malignant neoplasm of prostate: Secondary | ICD-10-CM | POA: Diagnosis not present

## 2020-04-09 DIAGNOSIS — N35012 Post-traumatic membranous urethral stricture: Secondary | ICD-10-CM | POA: Diagnosis not present

## 2020-04-10 DIAGNOSIS — J449 Chronic obstructive pulmonary disease, unspecified: Secondary | ICD-10-CM | POA: Diagnosis not present

## 2020-04-10 DIAGNOSIS — Z79899 Other long term (current) drug therapy: Secondary | ICD-10-CM | POA: Diagnosis not present

## 2020-04-10 DIAGNOSIS — M545 Low back pain: Secondary | ICD-10-CM | POA: Diagnosis not present

## 2020-04-10 DIAGNOSIS — I10 Essential (primary) hypertension: Secondary | ICD-10-CM | POA: Diagnosis not present

## 2020-04-10 DIAGNOSIS — E785 Hyperlipidemia, unspecified: Secondary | ICD-10-CM | POA: Diagnosis not present

## 2020-04-10 DIAGNOSIS — Z139 Encounter for screening, unspecified: Secondary | ICD-10-CM | POA: Diagnosis not present

## 2020-04-16 DIAGNOSIS — E875 Hyperkalemia: Secondary | ICD-10-CM | POA: Diagnosis not present

## 2020-04-23 DIAGNOSIS — E875 Hyperkalemia: Secondary | ICD-10-CM | POA: Diagnosis not present

## 2020-05-13 ENCOUNTER — Encounter (HOSPITAL_COMMUNITY): Payer: Self-pay | Admitting: Orthopedic Surgery

## 2020-05-13 DIAGNOSIS — M1611 Unilateral primary osteoarthritis, right hip: Secondary | ICD-10-CM | POA: Diagnosis not present

## 2020-05-13 DIAGNOSIS — N35919 Unspecified urethral stricture, male, unspecified site: Secondary | ICD-10-CM

## 2020-05-13 HISTORY — DX: Unspecified urethral stricture, male, unspecified site: N35.919

## 2020-05-13 HISTORY — DX: Unilateral primary osteoarthritis, right hip: M16.11

## 2020-05-13 NOTE — H&P (Addendum)
TOTAL HIP ADMISSION H&P  Patient is admitted for right total hip arthroplasty.  Subjective:  Chief Complaint: right hip pain  HPI: Aaron King, 73 y.o. male, has a history of pain and functional disability in the right hip(s) due to arthritis and patient has failed non-surgical conservative treatments for greater than 12 weeks to include NSAID's and/or analgesics, corticosteriod injections, supervised PT with diminished ADL's post treatment and use of assistive devices.  Onset of symptoms was gradual starting 10 years ago with gradually worsening course since that time.The patient noted no past surgery on the right hip(s).  Patient currently rates pain in the right hip at 10 out of 10 with activity. Patient has night pain, worsening of pain with activity and weight bearing, trendelenberg gait, pain that interfers with activities of daily living and pain with passive range of motion. Patient has evidence of subchondral cysts, periarticular osteophytes and joint space narrowing by imaging studies. This condition presents safety issues increasing the risk of falls..  There is no current active infection.  Patient Active Problem List   Diagnosis Date Noted  . Primary localized osteoarthritis of right hip 05/13/2020  . Urethral stricture 05/13/2020  . Unstable angina ([place]) 08/02/2013  . Chest pain 08/02/2013  . Back pain 08/02/2013  . CAD (coronary artery disease)   . Hypertension   . Dyslipidemia   . RASH AND OTHER NONSPECIFIC SKIN ERUPTION 11/13/2010  . HYPERLIPIDEMIA-MIXED 08/13/2010  . CAD, NATIVE VESSEL 06/09/2010  . PROSTATE CANCER 05/29/2010  . Alcohol abuse 05/29/2010  . TOBACCO USER 05/29/2010  . Essential hypertension 05/29/2010   Past Medical History:  Diagnosis Date  . Anticoagulant long-term use    plavix  . CAD (coronary artery disease) cardiologist--- dr dalton mclean   a. 05/2010 NSTEMI/Cath/PCI: LM 20, LAD min irregs, LCX min irregs, OM1 99(2.5x12 Promus DES), RCA 30p,  EF 65%;  b. effient d/c 2/2 to gum bleeding and epistaxis  . COPD with emphysema ([place])   . DOE (dyspnea on exertion)   . Dyslipidemia   . ETOH abuse   . Gross hematuria   . History of non-ST elevation myocardial infarction (NSTEMI) 05/2010   s/p  PCI and DES x1  . History of prostate cancer    s/p XRT and seed implantation in May 2010  . Hypertension   . Nocturia   . Primary localized osteoarthritis of right hip 05/13/2020  . S/P drug eluting coronary stent placement 05/2010  . Urethral stricture 05/13/2020   Urology says use 14 french catheter   Dr Junious Silk said to place in preop.  Call Urology if any issues in preop placing this catheter.  [phone]  . 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 APPENDECTOMY  1960  . COLONOSCOPY WITH PROPOFOL  last one 2017  . CORONARY ANGIOPLASTY WITH STENT PLACEMENT  05-15-2010  DR CRENSHAW   PCI W/ DRUG-ELUTING STENT X1 IN THE FIRST OBTUSE MARGINAL BRANCH OF CIRCUMFLEX ARTERY/ NON-STEMI/  EF 65%  . CYSTOSCOPY WITH URETHRAL DILATATION N/A 03/06/2019   Procedure: CYSTOSCOPY WITH URETHRAL STRICTURE DILATATION, FOLEY CATHETER PLACEMENT;  Surgeon: Festus Aloe, MD;  Location: [facility];  Service: Urology;  Laterality: N/A;  . MASS EXCISION  08/23/2012   Procedure: EXCISION MASS;  Surgeon: Leighton Ruff, MD;  Location: [facility];  Service: General;  Laterality: Left;  excisional biopsy left thigh mass  . RADIOACTIVE PROSTATE SEED IMPLANTS  03-31-2009   PROSTATE CANCER  .  REPAIR RIGHT INGUINAL HERNIA  1980    No current facility-administered medications for this encounter.   Current Outpatient Medications  Medication Sig Dispense Refill Last Dose  . albuterol (PROVENTIL HFA;VENTOLIN HFA) 108 (90 Base) MCG/ACT inhaler Inhale 2 puffs into the lungs every 4 (four) hours as needed for wheezing or shortness of breath.   Past Week at Unknown time  . aspirin EC 81 MG tablet Take 81 mg by mouth at  bedtime.    05/13/2020 at Unknown time  . atorvastatin (LIPITOR) 40 MG tablet Take 40 mg by mouth at bedtime.   05/12/2020 at Unknown time  . clopidogrel (PLAVIX) 75 MG tablet Take 1 tablet by mouth once daily (Patient taking differently: Take 75 mg by mouth daily. ) 90 tablet 2 05/13/2020 at Unknown time  . furosemide (LASIX) 20 MG tablet Take 20 mg by mouth daily as needed for edema.    05/12/2020 at Unknown time  . HYDROcodone-acetaminophen (NORCO) 7.5-325 MG tablet Take 1 tablet by mouth 5 (five) times daily as needed.   Past Month at Unknown time  . metoprolol succinate (TOPROL-XL) 25 MG 24 hr tablet Take 25 mg by mouth every morning.   05/13/2020 at Unknown time  . Tamsulosin HCl (FLOMAX) 0.4 MG CAPS Take 0.4 mg by mouth daily.    05/13/2020 at Unknown time  . nitroGLYCERIN (NITROSTAT) 0.4 MG SL tablet Place 1 tablet (0.4 mg total) under the tongue every 5 (five) minutes as needed. 25 tablet 7 Unknown at Unknown time   Allergies  Allergen Reactions  . Oxycodone Other (See Comments)    LETHARGIC  . Oxycontin [Oxycodone Hcl] Other (See Comments)    LETHARGIC    Social History   Tobacco Use  . Smoking status: Former Smoker    Packs/day: 2.00    Years: 30.00    Pack years: 60.00    Types: Cigarettes    Quit date: 05/14/2010    Years since quitting: 10.0  . Smokeless tobacco: Never Used  Substance Use Topics  . Alcohol use: Yes    Alcohol/week: 14.0 standard drinks    Types: 14 Cans of beer per week    Family History  Problem Relation Age of Onset  . Heart attack Mother        massive  . Heart disease Mother   . Congestive Heart Failure Father   . Heart disease Father   . Prostate cancer Brother      Review of Systems  Constitutional: Negative.   HENT: Negative.   Eyes: Negative.   Respiratory: Negative.   Cardiovascular: Positive for leg swelling.  Gastrointestinal: Negative.   Genitourinary: Positive for difficulty urinating.  Musculoskeletal: Positive for arthralgias, back  pain, gait problem and joint swelling.  Skin: Negative.   Allergic/Immunologic: Negative.   Hematological: Negative.   Psychiatric/Behavioral: Negative.     Objective:  Physical Exam Constitutional:      Appearance: Normal appearance.  HENT:     Head: Normocephalic and atraumatic.     Right Ear: Tympanic membrane normal.     Left Ear: Tympanic membrane normal.     Nose: Nose normal.     Mouth/Throat:     Mouth: Mucous membranes are moist.     Pharynx: Oropharynx is clear.  Eyes:     Extraocular Movements: Extraocular movements intact.     Pupils: Pupils are equal, round, and reactive to light.  Cardiovascular:     Rate and Rhythm: Normal rate and regular rhythm.  Pulses: Normal pulses.  Pulmonary:     Effort: Pulmonary effort is normal.  Abdominal:     General: Abdomen is flat. Bowel sounds are normal.  Genitourinary:    Comments: Not pertinent to current symptomatology therefore not examined. Musculoskeletal:     Cervical back: Neck supple.     Comments: Examination of the right hip reveals no swelling, bruising or deformity.  Limited range of motion in all planes.  Pain with internal rotation of the right hip.  Positive log roll test.  He is neurovascularly intact.    Skin:    General: Skin is warm.     Capillary Refill: Capillary refill takes less than 2 seconds.     Findings: Bruising present.  Neurological:     General: No focal deficit present.     Mental Status: He is alert.  Psychiatric:        Mood and Affect: Mood normal.     Vital signs in last 24 hours: Temp:  [98.3 F (36.8 C)] 98.3 F (36.8 C) (07/06 1300) Pulse Rate:  [72] 72 (07/06 1300) BP: (144)/(84) 144/84 (07/06 1300) SpO2:  [97 %] 97 % (07/06 1300) Weight:  [103.9 kg] 103.9 kg (07/06 1300)  Labs:   Estimated body mass index is 35.87 kg/m as calculated from the following:   Height as of this encounter: 5\' 7"  (1.702 m).   Weight as of this encounter: 103.9 kg.   Imaging  Review Plain radiographs demonstrate severe degenerative joint disease of the right hip(s). The bone quality appears to be good for age and reported activity level.      Assessment/Plan:  End stage arthritis, right hip(s) Principal Problem:   Primary localized osteoarthritis of right hip Active Problems:   Alcohol abuse   Essential hypertension   CAD (coronary artery disease)   Back pain   Urethral stricture   The patient history, physical examination, clinical judgement of the provider and imaging studies are consistent with end stage degenerative joint disease of the right hip(s) and total hip arthroplasty is deemed medically necessary. The treatment options including medical management, injection therapy, arthroscopy and arthroplasty were discussed at length. The risks and benefits of total hip arthroplasty were presented and reviewed. The risks due to aseptic loosening, infection, stiffness, dislocation/subluxation,  thromboembolic complications and other imponderables were discussed.  The patient acknowledged the explanation, agreed to proceed with the plan and consent was signed. Patient is being admitted for inpatient treatment for surgery, pain control, PT, OT, prophylactic antibiotics, VTE prophylaxis, progressive ambulation and ADL's and discharge planning.The patient is planning to be discharged to the home of Bolton and Lynnea Maizes.  Bobby's home phone [phone] with outpatient physical therapy  Plan is same day discharge.  Must be peeing well with a negative bladder scan showing no urinary retension prior to discharge    Urology says use 14 french catheter   Dr Junious Silk said to place in preop.  Call Urology if any issues in preop placing this catheter.  [phone]    Patient's anticipated LOS is less than 2 midnights, meeting these requirements: - Younger than 50 - Lives within 1 hour of care - Has a competent adult at home to recover with post-op recover - NO  history of  - Chronic pain requiring opiods  - Diabetes  - Coronary Artery Disease  - Heart failure  - Heart attack  - Stroke  - DVT/VTE  - Cardiac arrhythmia  - Respiratory Failure/COPD  - Renal failure  -  Anemia  - Advanced Liver disease

## 2020-05-19 NOTE — Progress Notes (Signed)
COVID Vaccine Completed: Date COVID Vaccine completed: COVID vaccine manufacturer: [employer]rell   PCP - Charlott Holler, NP Cardiologist - Saw Truitt Merle, NP on 01-23-20  Chest x-ray -  EKG - 01-23-20 Stress Test - 08-11-18 In Epic ECHO -  Cardiac Cath -   Sleep Study -  CPAP -   Fasting Blood Sugar -  Checks Blood Sugar _____ times a day  Blood Thinner Instructions: Plavix 75.  Will hold for 5 days prior to surg, to take ASA 81 mg during these 5 days per note 01-23-20 Aspirin Instructions: 81 mg to take 5 days piror to surg Last Dose:  Anesthesia review:   Patient denies shortness of breath, fever, cough and chest pain at PAT appointment   Patient verbalized understanding of instructions that were given to them at the PAT appointment. Patient was also instructed that they will need to review over the PAT instructions again at home before surgery.

## 2020-05-19 NOTE — Patient Instructions (Addendum)
DUE TO COVID-19 ONLY ONE VISITOR IS ALLOWED TO COME WITH YOU AND STAY IN THE WAITING ROOM ONLY DURING PRE OP AND  PROCEDURE. THEN TWO VISITORS MAY VISIT WITH YOU IN YOUR PRIVATE ROOM DURING VISITING HOURS ONLY!!   COVID SWAB TESTING MUST BE COMPLETED ON:     05-30-20 @11 :62 AM [address], [place] -Former [facility] enter pre surgical testing line (Must self quarantine after testing. Follow instructions on handout.)            Your procedure is scheduled on: 06-03-20    Report to [facility] Main  Entrance    Report to admitting at 7:25 AM   Call this number if you have problems the morning of surgery [phone]   Do not eat food :After Midnight.   May have liquids until  6:55 am  day of surgery   Complete one Ensure drink the morning of surgery at 6:55 am the day of surgery.  CLEAR LIQUID DIET  Foods Allowed                                                                     Foods Excluded  Water, Black Coffee and tea, regular and decaf              liquids that you cannot  Plain Jell-O in any flavor  (No red)                                     see through such as: Fruit ices (not with fruit pulp)                                      milk, soups, orange juice  Iced Popsicles (No red)                                     All solid food                                   Apple juices Sports drinks like Gatorade (No red) Lightly seasoned clear broth or consume(fat free) Sugar, honey syrup   Take these medicines the morning of surgery with A SIP OF WATER: Metoprolol (Toprol), may use Albuterol inhaler   Oral Hygiene is also important to reduce your risk of infection.                                     Remember - BRUSH YOUR TEETH THE MORNING OF SURGERY WITH YOUR REGULAR TOOTHPASTE.  AFTER THAT, NO ADDITIONAL WATER, CANDY, GUM, MINTS                                You may not have any metal on your body including jewelry, and body piercings  Do not wear lotions, powders, cologne, or deodorant                         Men may shave face and neck.   Do not bring valuables to the hospital. [place].   Contacts, dentures or bridgework may not be worn into surgery.   Bring small overnight bag day of surgery.                Please read over the following fact sheets you were given: IF YOU HAVE QUESTIONS ABOUT YOUR PRE OP INSTRUCTIONS PLEASE CALL  [phone]     Before surgery, you can play an important role.  Because skin is not sterile, your skin needs to be as free of germs as possible.  You can reduce the number of germs on your skin by washing with CHG (chlorahexidine gluconate) soap before surgery.  CHG is an antiseptic cleaner which kills germs and bonds with the skin to continue killing germs even after washing. Please DO NOT use if you have an allergy to CHG or antibacterial soaps.  If your skin becomes reddened/irritated stop using the CHG and inform your nurse when you arrive at Short Stay. Do not shave (including legs and underarms) for at least 48 hours prior to the first CHG shower.  You may shave your face/neck.  Please follow these instructions carefully:  1.  Shower with CHG Soap the night before surgery and the  morning of surgery.  2.  If you choose to wash your hair, wash your hair first as usual with your normal  shampoo.  3.  After you shampoo, rinse your hair and body thoroughly to remove the shampoo.                             4.  Use CHG as you would any other liquid soap.  You can apply chg directly to the skin and wash.  Gently with a scrungie or clean washcloth.  5.  Apply the CHG Soap to your body ONLY FROM THE NECK DOWN.   Do   not use on face/ open                           Wound or open sores. Avoid contact with eyes, ears mouth and   genitals (private parts).                       Wash face,  Genitals (private parts) with your normal soap.             6.   Wash thoroughly, paying special attention to the area where your    surgery  will be performed.  7.  Thoroughly rinse your body with warm water from the neck down.  8.  DO NOT shower/wash with your normal soap after using and rinsing off the CHG Soap.                9.  Pat yourself dry with a clean towel.            10.  Wear clean pajamas.            11.  Place clean sheets on your bed the night of your first shower and do not  sleep with pets. Day of Surgery :  Do not apply any lotions/deodorants the morning of surgery.  Please wear clean clothes to the hospital/surgery center.  FAILURE TO FOLLOW THESE INSTRUCTIONS MAY RESULT IN THE CANCELLATION OF YOUR SURGERY  PATIENT SIGNATURE_________________________________  NURSE SIGNATURE__________________________________  ________________________________________________________________________   Aaron King  An incentive spirometer is a tool that can help keep your lungs clear and active. This tool measures how well you are filling your lungs with each breath. Taking long deep breaths may help reverse or decrease the chance of developing breathing (pulmonary) problems (especially infection) following:  A long period of time when you are unable to move or be active. BEFORE THE PROCEDURE   If the spirometer includes an indicator to show your best effort, your nurse or respiratory therapist will set it to a desired goal.  If possible, sit up straight or lean slightly forward. Try not to slouch.  Hold the incentive spirometer in an upright position. INSTRUCTIONS FOR USE  1. Sit on the edge of your bed if possible, or sit up as far as you can in bed or on a chair. 2. Hold the incentive spirometer in an upright position. 3. Breathe out normally. 4. Place the mouthpiece in your mouth and seal your lips tightly around it. 5. Breathe in slowly and as deeply as possible, raising the piston or the ball toward the top of the column. 6. Hold  your breath for 3-5 seconds or for as long as possible. Allow the piston or ball to fall to the bottom of the column. 7. Remove the mouthpiece from your mouth and breathe out normally. 8. Rest for a few seconds and repeat Steps 1 through 7 at least 10 times every 1-2 hours when you are awake. Take your time and take a few normal breaths between deep breaths. 9. The spirometer may include an indicator to show your best effort. Use the indicator as a goal to work toward during each repetition. 10. After each set of 10 deep breaths, practice coughing to be sure your lungs are clear. If you have an incision (the cut made at the time of surgery), support your incision when coughing by placing a pillow or rolled up towels firmly against it. Once you are able to get out of bed, walk around indoors and cough well. You may stop using the incentive spirometer when instructed by your caregiver.  RISKS AND COMPLICATIONS  Take your time so you do not get dizzy or light-headed.  If you are in pain, you may need to take or ask for pain medication before doing incentive spirometry. It is harder to take a deep breath if you are having pain. AFTER USE  Rest and breathe slowly and easily.  It can be helpful to keep track of a log of your progress. Your caregiver can provide you with a simple table to help with this. If you are using the spirometer at home, follow these instructions: [place] IF:   You are having difficultly using the spirometer.  You have trouble using the spirometer as often as instructed.  Your pain medication is not giving enough relief while using the spirometer.  You develop fever of 100.5 F (38.1 C) or higher. SEEK IMMEDIATE MEDICAL CARE IF:   You cough up bloody sputum that had not been present before.  You develop fever of 102 F (38.9 C) or greater.  You develop worsening pain at or near the incision site. MAKE SURE YOU:   Understand these instructions.  Will  watch  your condition.  Will get help right away if you are not doing well or get worse. Document Released: 03/07/2007 Document Revised: 01/17/2012 Document Reviewed: 05/08/2007 [facility] Patient Information 2014 [place], [place].   ________________________________________________________________________

## 2020-05-22 ENCOUNTER — Encounter (HOSPITAL_COMMUNITY)
Admission: RE | Admit: 2020-05-22 | Discharge: 2020-05-22 | Disposition: A | Discharge status: Home / Self Care | Payer: Medicare Other | Source: Ambulatory Visit | Attending: Orthopedic Surgery | Admitting: Orthopedic Surgery

## 2020-05-22 ENCOUNTER — Encounter (HOSPITAL_COMMUNITY): Payer: Self-pay

## 2020-05-22 ENCOUNTER — Other Ambulatory Visit: Payer: Self-pay

## 2020-05-22 DIAGNOSIS — Z01812 Encounter for preprocedural laboratory examination: Secondary | ICD-10-CM | POA: Diagnosis not present

## 2020-05-22 LAB — CBC WITH DIFFERENTIAL/PLATELET
Abs Immature Granulocytes: 0.05 10*3/uL (ref 0.00–0.07)
Basophils Absolute: 0.1 10*3/uL (ref 0.0–0.1)
Basophils Relative: 1 %
Eosinophils Absolute: 0.2 10*3/uL (ref 0.0–0.5)
Eosinophils Relative: 2 %
HCT: 37.5 % — ABNORMAL LOW (ref 39.0–52.0)
Hemoglobin: 12.4 g/dL — ABNORMAL LOW (ref 13.0–17.0)
Immature Granulocytes: 1 %
Lymphocytes Relative: 18 %
Lymphs Abs: 1.6 10*3/uL (ref 0.7–4.0)
MCH: 34.7 pg — ABNORMAL HIGH (ref 26.0–34.0)
MCHC: 33.1 g/dL (ref 30.0–36.0)
MCV: 105 fL — ABNORMAL HIGH (ref 80.0–100.0)
Monocytes Absolute: 0.7 10*3/uL (ref 0.1–1.0)
Monocytes Relative: 8 %
Neutro Abs: 6.2 10*3/uL (ref 1.7–7.7)
Neutrophils Relative %: 70 %
Platelets: 278 10*3/uL (ref 150–400)
RBC: 3.57 MIL/uL — ABNORMAL LOW (ref 4.22–5.81)
RDW: 14.2 % (ref 11.5–15.5)
WBC: 8.9 10*3/uL (ref 4.0–10.5)
nRBC: 0 % (ref 0.0–0.2)

## 2020-05-22 LAB — PROTIME-INR
INR: 1 (ref 0.8–1.2)
Prothrombin Time: 12.8 seconds (ref 11.4–15.2)

## 2020-05-22 LAB — COMPREHENSIVE METABOLIC PANEL
ALT: 18 U/L (ref 0–44)
AST: 34 U/L (ref 15–41)
Albumin: 3.7 g/dL (ref 3.5–5.0)
Alkaline Phosphatase: 65 U/L (ref 38–126)
Anion gap: 11 (ref 5–15)
BUN: 17 mg/dL (ref 8–23)
CO2: 28 mmol/L (ref 22–32)
Calcium: 8.9 mg/dL (ref 8.9–10.3)
Chloride: 99 mmol/L (ref 98–111)
Creatinine, Ser: 0.97 mg/dL (ref 0.61–1.24)
GFR calc Af Amer: >60 mL/min (ref >60)
GFR calc non Af Amer: >60 mL/min (ref >60)
Glucose, Bld: 97 mg/dL (ref 70–99)
Potassium: 4.3 mmol/L (ref 3.5–5.1)
Sodium: 138 mmol/L (ref 135–145)
Total Bilirubin: 0.6 mg/dL (ref 0.3–1.2)
Total Protein: 7.2 g/dL (ref 6.5–8.1)

## 2020-05-22 LAB — SURGICAL PCR SCREEN
MRSA, PCR: NEGATIVE
Staphylococcus aureus: NEGATIVE

## 2020-05-22 LAB — APTT: aPTT: 26 seconds (ref 24–36)

## 2020-05-22 NOTE — Progress Notes (Signed)
   05/22/20 1029  OBSTRUCTIVE SLEEP APNEA  Have you ever been diagnosed with sleep apnea through a sleep study? No  Do you snore loudly (loud enough to be heard through closed doors)?  0  Do you often feel tired, fatigued, or sleepy during the daytime (such as falling asleep during driving or talking to someone)? 0  Has anyone observed you stop breathing during your sleep? 0  Do you have, or are you being treated for high blood pressure? 1  BMI more than 35 kg/m2? 1  Age > 50 (1-yes) 1  Neck circumference greater than:Male 16 inches or larger, Male 17inches or larger? 1  Male Gender (Yes=1) 1  Obstructive Sleep Apnea Score 5  Score 5 or greater  Results sent to PCP

## 2020-05-23 LAB — URINE CULTURE: Culture: NO GROWTH

## 2020-05-28 NOTE — Care Plan (Signed)
Ortho Bundle Case Management Note  Patient Details  Name: Aaron King MRN: [id number] Date of Birth: 03/05/1946     Met with patient in the office prior to surgery. He will discharge to home with friends to assist. Has equipment at home. OPPT set up with [facility]. Patient and MD in agreement with plan. Choice offered                 DME Arranged:    DME Agency:     HH Arranged:    HH Agency:     Additional Comments: Please contact me with any questions of if this plan should need to change.  Ladell Heads,  [facility]  [phone] 05/28/2020, 3:09 PM

## 2020-05-29 NOTE — Progress Notes (Signed)
Anesthesia Chart Review   Case: [id number] Date/Time: 06/03/20 0940   Procedure: TOTAL HIP ARTHROPLASTY ANTERIOR APPROACH (Right Hip)   Anesthesia type: Choice   Pre-op diagnosis: OSTEOARTHRITIS  RIGHT HIP   Location: [facility] ROOM 08 / [facility]   Surgeons: Renette Butters, MD      DISCUSSION:74 y.o. former smoker (60 pack years, quit 05/14/10) with h/o HTN, CAD (NSTEMI 7/11, DES to OM; on Plavix), COPD, ETOH abuse (reports 14 cans of beer daily), right hip OA scheduled for above procedure 06/03/2020 with Dr. Edmonia Lynch.    Pt last seen by cardiology 3/1/72021 for preoperative evaluation.  Per OV note, "Pre op clearance - wanting hip replacement - not scheduled yet - low risk Myoview from 08/2018 - he has no active symptoms - he can complete 4 mets of activity - he is clearly becoming limited due to chronic pain. He is felt to be an acceptable candidate - although will be at some increased risk given his known cardiac issues - will hold Plavix for 5 days prior to hip surgery, use baby aspirin during these 5 days - restart Plavix as soon as deemed ok by orthopedics. Will be available as needed."  Anticipate pt can proceed with planned procedure barring acute status change.   VS: BP 140/66   Pulse 64   Temp 37.2 C (Oral)   Resp 18   Ht 5\' 7"  (1.702 m)   Wt 104 kg   SpO2 98%   BMI 35.90 kg/m   PROVIDERS: Philmore Pali, NP is PCP   [name], MD is Cardiologist  LABS: Labs reviewed: Acceptable for surgery. (all labs ordered are listed, but only abnormal results are displayed)  Labs Reviewed  CBC WITH DIFFERENTIAL/PLATELET - Abnormal; Notable for the following components:      Result Value   RBC 3.57 (*)    Hemoglobin 12.4 (*)    HCT 37.5 (*)    MCV 105.0 (*)    MCH 34.7 (*)    All other components within normal limits  URINE CULTURE  SURGICAL PCR SCREEN  APTT  COMPREHENSIVE METABOLIC PANEL  PROTIME-INR     IMAGES:   EKG: 01/23/2020 Rate 68 bpm SR Nonspecific ST  changes   CV: Stress Test 08/11/2018  The left ventricular ejection fraction is hyperdynamic (>65%).  Nuclear stress EF: 67%.  There was no ST segment deviation noted during stress.  The study is normal.  This is a low risk study.  Past Medical History:  Diagnosis Date  . Anticoagulant long-term use    plavix  . CAD (coronary artery disease) cardiologist--- dr dalton mclean   a. 05/2010 NSTEMI/Cath/PCI: LM 20, LAD min irregs, LCX min irregs, OM1 99(2.5x12 Promus DES), RCA 30p, EF 65%;  b. effient d/c 2/2 to gum bleeding and epistaxis  . COPD with emphysema ([place])   . DOE (dyspnea on exertion)   . Dyslipidemia   . ETOH abuse   . Gross hematuria   . History of non-ST elevation myocardial infarction (NSTEMI) 05/2010   s/p  PCI and DES x1  . History of prostate cancer    s/p XRT and seed implantation in May 2010  . Hypertension   . Nocturia   . Primary localized osteoarthritis of right hip 05/13/2020  . S/P drug eluting coronary stent placement 05/2010  . Urethral stricture 05/13/2020   Urology says use 14 french catheter   Dr Junious Silk said to place in preop.  Call Urology if any issues in  preop placing this catheter.  [phone]  . 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 APPENDECTOMY  1960  . COLONOSCOPY WITH PROPOFOL  last one 2017  . CORONARY ANGIOPLASTY WITH STENT PLACEMENT  05-15-2010  DR CRENSHAW   PCI W/ DRUG-ELUTING STENT X1 IN THE FIRST OBTUSE MARGINAL BRANCH OF CIRCUMFLEX ARTERY/ NON-STEMI/  EF 65%  . CYSTOSCOPY WITH URETHRAL DILATATION N/A 03/06/2019   Procedure: CYSTOSCOPY WITH URETHRAL STRICTURE DILATATION, FOLEY CATHETER PLACEMENT;  Surgeon: Festus Aloe, MD;  Location: [facility];  Service: Urology;  Laterality: N/A;  . MASS EXCISION  08/23/2012   Procedure: EXCISION MASS;  Surgeon: Leighton Ruff, MD;  Location: [facility];  Service: General;  Laterality: Left;  excisional biopsy left thigh mass  .  RADIOACTIVE PROSTATE SEED IMPLANTS  03-31-2009   PROSTATE CANCER  . REPAIR RIGHT INGUINAL HERNIA  1980    MEDICATIONS: . albuterol (PROVENTIL HFA;VENTOLIN HFA) 108 (90 Base) MCG/ACT inhaler  . aspirin EC 81 MG tablet  . atorvastatin (LIPITOR) 40 MG tablet  . clopidogrel (PLAVIX) 75 MG tablet  . furosemide (LASIX) 20 MG tablet  . HYDROcodone-acetaminophen (NORCO) 7.5-325 MG tablet  . metoprolol succinate (TOPROL-XL) 25 MG 24 hr tablet  . nitroGLYCERIN (NITROSTAT) 0.4 MG SL tablet  . Tamsulosin HCl (FLOMAX) 0.4 MG CAPS   No current facility-administered medications for this encounter.    Konrad Felix, PA-C [facility] Pre-Surgical Testing [phone]

## 2020-05-29 NOTE — Anesthesia Preprocedure Evaluation (Addendum)
Anesthesia Evaluation  Patient identified by MRN, date of birth, ID band Patient awake    Reviewed: Allergy & Precautions, NPO status , Patient's Chart, lab work & pertinent test results, reviewed documented beta blocker date and time   History of Anesthesia Complications Negative for: history of anesthetic complications  Airway Mallampati: II  TM Distance: >3 FB Neck ROM: Full    Dental  (+) Chipped, Dental Advisory Given   Pulmonary COPD,  COPD inhaler, former smoker,  05/30/2020 SARS coronavirus NEG   breath sounds clear to auscultation       Cardiovascular hypertension, Pt. on medications and Pt. on home beta blockers (-) angina+ CAD, + Past MI and + Cardiac Stents   Rhythm:Regular Rate:Normal  '19 myoview:  Nuclear stress EF: 67%. no ST segment deviation noted during stress.  The study is normal.  This is a low risk study    Neuro/Psych negative neurological ROS     GI/Hepatic negative GI ROS, (+)     substance abuse  alcohol use,   Endo/Other  Morbid obesity  Renal/GU negative Renal ROS   H/o prostate cancer    Musculoskeletal  (+) Arthritis , Osteoarthritis,    Abdominal (+) + obese,   Peds  Hematology Plavix: last dose 7/21/20201 plt 278k   Anesthesia Other Findings   Reproductive/Obstetrics                           Anesthesia Physical Anesthesia Plan  ASA: III  Anesthesia Plan: Spinal   Post-op Pain Management:    Induction:   PONV Risk Score and Plan: 1 and Treatment may vary due to age or medical condition  Airway Management Planned: Natural Airway and Simple Face Mask  Additional Equipment: None  Intra-op Plan:   Post-operative Plan:   Informed Consent: I have reviewed the patients History and Physical, chart, labs and discussed the procedure including the risks, benefits and alternatives for the proposed anesthesia with the patient or authorized  representative who has indicated his/her understanding and acceptance.     Dental advisory given  Plan Discussed with: CRNA and Surgeon  Anesthesia Plan Comments: (See PAT note 05/22/2020, Konrad Felix, PA-C)      Anesthesia Quick Evaluation

## 2020-05-30 ENCOUNTER — Other Ambulatory Visit (HOSPITAL_COMMUNITY)
Admission: RE | Admit: 2020-05-30 | Discharge: 2020-05-30 | Disposition: A | Discharge status: Home / Self Care | Payer: Medicare Other | Source: Ambulatory Visit | Attending: Orthopedic Surgery | Admitting: Orthopedic Surgery

## 2020-05-30 DIAGNOSIS — Z20822 Contact with and (suspected) exposure to covid-19: Secondary | ICD-10-CM | POA: Diagnosis not present

## 2020-05-30 DIAGNOSIS — Z01812 Encounter for preprocedural laboratory examination: Secondary | ICD-10-CM | POA: Diagnosis not present

## 2020-05-30 LAB — SARS CORONAVIRUS 2 (TAT 6-24 HRS): SARS Coronavirus 2: NEGATIVE

## 2020-06-02 DIAGNOSIS — R8271 Bacteriuria: Secondary | ICD-10-CM | POA: Diagnosis not present

## 2020-06-02 DIAGNOSIS — N35012 Post-traumatic membranous urethral stricture: Secondary | ICD-10-CM | POA: Diagnosis not present

## 2020-06-02 MED ORDER — DEXTROSE 5 % IV SOLN
3.0000 g | INTRAVENOUS | Status: AC
  Administered 2020-06-03: 3 g via INTRAVENOUS
  Filled 2020-06-02: qty 3

## 2020-06-02 MED ORDER — BUPIVACAINE LIPOSOME 1.3 % IJ SUSP
10.0000 mL | Freq: Once | INTRAMUSCULAR | Status: AC
  Filled 2020-06-02: qty 10

## 2020-06-03 ENCOUNTER — Ambulatory Visit (HOSPITAL_COMMUNITY): Payer: Medicare Other | Admitting: Physician Assistant

## 2020-06-03 ENCOUNTER — Encounter (HOSPITAL_COMMUNITY): Payer: Self-pay | Admitting: Orthopedic Surgery

## 2020-06-03 ENCOUNTER — Encounter (HOSPITAL_COMMUNITY)
Admission: RE | Disposition: A | Discharge status: Home / Self Care | Payer: Self-pay | Source: Home / Self Care | Attending: Orthopedic Surgery

## 2020-06-03 ENCOUNTER — Ambulatory Visit (HOSPITAL_COMMUNITY): Payer: Medicare Other | Admitting: Certified Registered Nurse Anesthetist

## 2020-06-03 ENCOUNTER — Other Ambulatory Visit: Payer: Self-pay

## 2020-06-03 ENCOUNTER — Ambulatory Visit (HOSPITAL_COMMUNITY): Payer: Medicare Other

## 2020-06-03 ENCOUNTER — Observation Stay (HOSPITAL_COMMUNITY)
Admission: RE | Admit: 2020-06-03 | Discharge: 2020-06-04 | Disposition: A | Discharge status: Home / Self Care | Payer: Medicare Other | Attending: Orthopedic Surgery | Admitting: Orthopedic Surgery

## 2020-06-03 DIAGNOSIS — Z87891 Personal history of nicotine dependence: Secondary | ICD-10-CM | POA: Diagnosis not present

## 2020-06-03 DIAGNOSIS — J449 Chronic obstructive pulmonary disease, unspecified: Secondary | ICD-10-CM | POA: Diagnosis not present

## 2020-06-03 DIAGNOSIS — Z79899 Other long term (current) drug therapy: Secondary | ICD-10-CM | POA: Diagnosis not present

## 2020-06-03 DIAGNOSIS — Z7982 Long term (current) use of aspirin: Secondary | ICD-10-CM | POA: Insufficient documentation

## 2020-06-03 DIAGNOSIS — Z8546 Personal history of malignant neoplasm of prostate: Secondary | ICD-10-CM | POA: Diagnosis not present

## 2020-06-03 DIAGNOSIS — I119 Hypertensive heart disease without heart failure: Secondary | ICD-10-CM | POA: Diagnosis not present

## 2020-06-03 DIAGNOSIS — M549 Dorsalgia, unspecified: Secondary | ICD-10-CM | POA: Diagnosis not present

## 2020-06-03 DIAGNOSIS — Z419 Encounter for procedure for purposes other than remedying health state, unspecified: Secondary | ICD-10-CM

## 2020-06-03 DIAGNOSIS — I2510 Atherosclerotic heart disease of native coronary artery without angina pectoris: Secondary | ICD-10-CM | POA: Diagnosis present

## 2020-06-03 DIAGNOSIS — I25110 Atherosclerotic heart disease of native coronary artery with unstable angina pectoris: Secondary | ICD-10-CM | POA: Insufficient documentation

## 2020-06-03 DIAGNOSIS — M1611 Unilateral primary osteoarthritis, right hip: Secondary | ICD-10-CM | POA: Diagnosis not present

## 2020-06-03 DIAGNOSIS — N35919 Unspecified urethral stricture, male, unspecified site: Secondary | ICD-10-CM | POA: Diagnosis not present

## 2020-06-03 DIAGNOSIS — E782 Mixed hyperlipidemia: Secondary | ICD-10-CM | POA: Diagnosis not present

## 2020-06-03 DIAGNOSIS — M25551 Pain in right hip: Secondary | ICD-10-CM | POA: Diagnosis present

## 2020-06-03 DIAGNOSIS — I10 Essential (primary) hypertension: Secondary | ICD-10-CM | POA: Diagnosis present

## 2020-06-03 DIAGNOSIS — F1010 Alcohol abuse, uncomplicated: Secondary | ICD-10-CM | POA: Diagnosis not present

## 2020-06-03 DIAGNOSIS — Z471 Aftercare following joint replacement surgery: Secondary | ICD-10-CM | POA: Diagnosis not present

## 2020-06-03 DIAGNOSIS — Z96641 Presence of right artificial hip joint: Secondary | ICD-10-CM | POA: Diagnosis not present

## 2020-06-03 HISTORY — PX: TOTAL HIP ARTHROPLASTY: SHX124

## 2020-06-03 SURGERY — ARTHROPLASTY, HIP, TOTAL, ANTERIOR APPROACH
Anesthesia: Spinal | Site: Hip | Laterality: Right

## 2020-06-03 MED ORDER — BUPIVACAINE IN DEXTROSE 0.75-8.25 % IT SOLN
INTRATHECAL | Status: DC | PRN
  Administered 2020-06-03: 1.8 mL via INTRATHECAL

## 2020-06-03 MED ORDER — HYDROCODONE-ACETAMINOPHEN 5-325 MG PO TABS
1.0000 | ORAL_TABLET | ORAL | Status: DC | PRN
  Administered 2020-06-03 – 2020-06-04 (×2): 1 via ORAL
  Filled 2020-06-03 (×2): qty 1

## 2020-06-03 MED ORDER — HYDROMORPHONE HCL 1 MG/ML IJ SOLN: 0.2500 mg | INTRAMUSCULAR | Status: DC | PRN

## 2020-06-03 MED ORDER — ASPIRIN 81 MG PO CHEW
81.0000 mg | CHEWABLE_TABLET | Freq: Two times a day (BID) | ORAL | Status: DC
  Administered 2020-06-03 – 2020-06-04 (×2): 81 mg via ORAL
  Filled 2020-06-03 (×2): qty 1

## 2020-06-03 MED ORDER — LACTATED RINGERS IV SOLN: INTRAVENOUS | Status: DC

## 2020-06-03 MED ORDER — MORPHINE SULFATE (PF) 4 MG/ML IV SOLN: 0.5000 mg | INTRAVENOUS | Status: DC | PRN

## 2020-06-03 MED ORDER — METHOCARBAMOL 500 MG IVPB - SIMPLE MED
INTRAVENOUS | Status: AC
  Administered 2020-06-03: 500 mg
  Filled 2020-06-03: qty 50

## 2020-06-03 MED ORDER — MIDAZOLAM HCL 2 MG/2ML IJ SOLN: 0.5000 mg | Freq: Once | INTRAMUSCULAR | Status: DC | PRN

## 2020-06-03 MED ORDER — ONDANSETRON HCL 4 MG/2ML IJ SOLN
INTRAMUSCULAR | Status: AC
  Filled 2020-06-03: qty 2

## 2020-06-03 MED ORDER — FENTANYL CITRATE (PF) 100 MCG/2ML IJ SOLN
INTRAMUSCULAR | Status: DC | PRN
  Administered 2020-06-03: 50 ug via INTRAVENOUS

## 2020-06-03 MED ORDER — DOCUSATE SODIUM 100 MG PO CAPS
100.0000 mg | ORAL_CAPSULE | Freq: Two times a day (BID) | ORAL | Status: DC
  Administered 2020-06-03 – 2020-06-04 (×2): 100 mg via ORAL
  Filled 2020-06-03 (×2): qty 1

## 2020-06-03 MED ORDER — ORAL CARE MOUTH RINSE: 15.0000 mL | Freq: Once | OROMUCOSAL | Status: AC

## 2020-06-03 MED ORDER — PROMETHAZINE HCL 25 MG/ML IJ SOLN: 6.2500 mg | INTRAMUSCULAR | Status: DC | PRN

## 2020-06-03 MED ORDER — POVIDONE-IODINE 10 % EX SWAB: 2.0000 "application " | Freq: Once | CUTANEOUS | Status: DC

## 2020-06-03 MED ORDER — PROPOFOL 500 MG/50ML IV EMUL
INTRAVENOUS | Status: AC
  Filled 2020-06-03: qty 50

## 2020-06-03 MED ORDER — ONDANSETRON HCL 4 MG/2ML IJ SOLN
INTRAMUSCULAR | Status: DC | PRN
  Administered 2020-06-03: 4 mg via INTRAVENOUS

## 2020-06-03 MED ORDER — METOCLOPRAMIDE HCL 5 MG PO TABS: 5.0000 mg | ORAL_TABLET | Freq: Three times a day (TID) | ORAL | Status: DC | PRN

## 2020-06-03 MED ORDER — PROPOFOL 10 MG/ML IV BOLUS
INTRAVENOUS | Status: DC | PRN
  Administered 2020-06-03 (×4): 20 mg via INTRAVENOUS
  Administered 2020-06-03: 10 mg via INTRAVENOUS
  Administered 2020-06-03: 20 mg via INTRAVENOUS

## 2020-06-03 MED ORDER — BISACODYL 10 MG RE SUPP: 10.0000 mg | Freq: Every day | RECTAL | Status: DC | PRN

## 2020-06-03 MED ORDER — CEFAZOLIN SODIUM-DEXTROSE 2-4 GM/100ML-% IV SOLN
INTRAVENOUS | Status: AC
  Administered 2020-06-03: 2 g via INTRAVENOUS
  Filled 2020-06-03: qty 100

## 2020-06-03 MED ORDER — TRANEXAMIC ACID-NACL 1000-0.7 MG/100ML-% IV SOLN
INTRAVENOUS | Status: AC
  Administered 2020-06-03: 1000 mg via INTRAVENOUS
  Filled 2020-06-03: qty 100

## 2020-06-03 MED ORDER — MENTHOL 3 MG MT LOZG: 1.0000 | LOZENGE | OROMUCOSAL | Status: DC | PRN

## 2020-06-03 MED ORDER — SODIUM CHLORIDE FLUSH 0.9 % IV SOLN
INTRAVENOUS | Status: DC | PRN
  Administered 2020-06-03: 20 mL

## 2020-06-03 MED ORDER — ACETAMINOPHEN 325 MG PO TABS: 325.0000 mg | ORAL_TABLET | Freq: Four times a day (QID) | ORAL | Status: DC | PRN

## 2020-06-03 MED ORDER — ONDANSETRON HCL 4 MG/2ML IJ SOLN: 4.0000 mg | Freq: Four times a day (QID) | INTRAMUSCULAR | Status: DC | PRN

## 2020-06-03 MED ORDER — ATORVASTATIN CALCIUM 40 MG PO TABS
40.0000 mg | ORAL_TABLET | Freq: Every day | ORAL | Status: DC
  Administered 2020-06-03: 40 mg via ORAL
  Filled 2020-06-03: qty 1

## 2020-06-03 MED ORDER — TRANEXAMIC ACID-NACL 1000-0.7 MG/100ML-% IV SOLN: 1000.0000 mg | Freq: Once | INTRAVENOUS | Status: AC

## 2020-06-03 MED ORDER — TAMSULOSIN HCL 0.4 MG PO CAPS
0.4000 mg | ORAL_CAPSULE | Freq: Every day | ORAL | Status: DC
  Administered 2020-06-04: 0.4 mg via ORAL
  Filled 2020-06-03: qty 1

## 2020-06-03 MED ORDER — BUPIVACAINE LIPOSOME 1.3 % IJ SUSP
INTRAMUSCULAR | Status: DC | PRN
  Administered 2020-06-03: 10 mL

## 2020-06-03 MED ORDER — NITROGLYCERIN 0.4 MG SL SUBL: 0.4000 mg | SUBLINGUAL_TABLET | SUBLINGUAL | Status: DC | PRN

## 2020-06-03 MED ORDER — DEXAMETHASONE SODIUM PHOSPHATE 10 MG/ML IJ SOLN
INTRAMUSCULAR | Status: AC
  Filled 2020-06-03: qty 1

## 2020-06-03 MED ORDER — METOPROLOL SUCCINATE ER 25 MG PO TB24
25.0000 mg | ORAL_TABLET | Freq: Every morning | ORAL | Status: DC
  Administered 2020-06-04: 25 mg via ORAL
  Filled 2020-06-03: qty 1

## 2020-06-03 MED ORDER — PHENOL 1.4 % MT LIQD: 1.0000 | OROMUCOSAL | Status: DC | PRN

## 2020-06-03 MED ORDER — PHENYLEPHRINE HCL (PRESSORS) 10 MG/ML IV SOLN
INTRAVENOUS | Status: AC
  Filled 2020-06-03: qty 1

## 2020-06-03 MED ORDER — PROPOFOL 10 MG/ML IV BOLUS
INTRAVENOUS | Status: AC
  Filled 2020-06-03: qty 20

## 2020-06-03 MED ORDER — MEPERIDINE HCL 50 MG/ML IJ SOLN: 6.2500 mg | INTRAMUSCULAR | Status: DC | PRN

## 2020-06-03 MED ORDER — CHLORHEXIDINE GLUCONATE 0.12 % MT SOLN
15.0000 mL | Freq: Once | OROMUCOSAL | Status: AC
  Administered 2020-06-03: 15 mL via OROMUCOSAL

## 2020-06-03 MED ORDER — FENTANYL CITRATE (PF) 100 MCG/2ML IJ SOLN
INTRAMUSCULAR | Status: AC
  Filled 2020-06-03: qty 2

## 2020-06-03 MED ORDER — 0.9 % SODIUM CHLORIDE (POUR BTL) OPTIME
TOPICAL | Status: DC | PRN
  Administered 2020-06-03: 1000 mL

## 2020-06-03 MED ORDER — ONDANSETRON HCL 4 MG PO TABS: 4.0000 mg | ORAL_TABLET | Freq: Four times a day (QID) | ORAL | Status: DC | PRN

## 2020-06-03 MED ORDER — WATER FOR IRRIGATION, STERILE IR SOLN
Status: DC | PRN
  Administered 2020-06-03: 2000 mL

## 2020-06-03 MED ORDER — HYDROCODONE-ACETAMINOPHEN 7.5-325 MG PO TABS: 1.0000 | ORAL_TABLET | ORAL | Status: DC | PRN

## 2020-06-03 MED ORDER — POVIDONE-IODINE 7.5 % EX SOLN: Freq: Once | CUTANEOUS | Status: DC

## 2020-06-03 MED ORDER — DEXAMETHASONE SODIUM PHOSPHATE 10 MG/ML IJ SOLN
8.0000 mg | Freq: Once | INTRAMUSCULAR | Status: AC
  Administered 2020-06-03: 8 mg via INTRAVENOUS

## 2020-06-03 MED ORDER — PROPOFOL 500 MG/50ML IV EMUL
INTRAVENOUS | Status: DC | PRN
  Administered 2020-06-03: 50 ug/kg/min via INTRAVENOUS

## 2020-06-03 MED ORDER — SODIUM CHLORIDE (PF) 0.9 % IJ SOLN
INTRAMUSCULAR | Status: AC
  Filled 2020-06-03: qty 50

## 2020-06-03 MED ORDER — PHENYLEPHRINE HCL-NACL 10-0.9 MG/250ML-% IV SOLN
INTRAVENOUS | Status: DC | PRN
Start: 2020-06-03 — End: 2020-06-03
  Administered 2020-06-03: 40 ug/min via INTRAVENOUS

## 2020-06-03 MED ORDER — POLYETHYLENE GLYCOL 3350 17 G PO PACK: 17.0000 g | PACK | Freq: Every day | ORAL | Status: DC | PRN

## 2020-06-03 MED ORDER — ALBUTEROL SULFATE (2.5 MG/3ML) 0.083% IN NEBU: 2.5000 mg | INHALATION_SOLUTION | RESPIRATORY_TRACT | Status: DC | PRN

## 2020-06-03 MED ORDER — TRANEXAMIC ACID-NACL 1000-0.7 MG/100ML-% IV SOLN
1000.0000 mg | INTRAVENOUS | Status: AC
  Administered 2020-06-03: 1000 mg via INTRAVENOUS
  Filled 2020-06-03: qty 100

## 2020-06-03 MED ORDER — FUROSEMIDE 20 MG PO TABS: 20.0000 mg | ORAL_TABLET | Freq: Every day | ORAL | Status: DC | PRN

## 2020-06-03 MED ORDER — CEFAZOLIN SODIUM-DEXTROSE 2-4 GM/100ML-% IV SOLN
2.0000 g | Freq: Four times a day (QID) | INTRAVENOUS | Status: AC
  Administered 2020-06-03: 2 g via INTRAVENOUS
  Filled 2020-06-03: qty 100

## 2020-06-03 MED ORDER — ACETAMINOPHEN 500 MG PO TABS
500.0000 mg | ORAL_TABLET | Freq: Four times a day (QID) | ORAL | Status: DC
  Administered 2020-06-03 – 2020-06-04 (×3): 500 mg via ORAL
  Filled 2020-06-03 (×3): qty 1

## 2020-06-03 MED ORDER — METOCLOPRAMIDE HCL 5 MG/ML IJ SOLN: 5.0000 mg | Freq: Three times a day (TID) | INTRAMUSCULAR | Status: DC | PRN

## 2020-06-03 MED ORDER — POVIDONE-IODINE 10 % EX SWAB
2.0000 "application " | Freq: Once | CUTANEOUS | Status: AC
  Administered 2020-06-03: 2 via TOPICAL

## 2020-06-03 MED ORDER — CLOPIDOGREL BISULFATE 75 MG PO TABS
75.0000 mg | ORAL_TABLET | Freq: Every day | ORAL | Status: DC
  Administered 2020-06-04: 75 mg via ORAL
  Filled 2020-06-03: qty 1

## 2020-06-03 SURGICAL SUPPLY — 43 items
BAG ZIPLOCK 12X15 (MISCELLANEOUS) IMPLANT
BLADE SAG 18X100X1.27 (BLADE) ×2 IMPLANT
BLADE SURG SZ10 CARB STEEL (BLADE) ×4 IMPLANT
CHLORAPREP W/TINT 26 (MISCELLANEOUS) ×2 IMPLANT
CLSR STERI-STRIP ANTIMIC 1/2X4 (GAUZE/BANDAGES/DRESSINGS) ×2 IMPLANT
COVER PERINEAL POST (MISCELLANEOUS) ×2 IMPLANT
COVER SURGICAL LIGHT HANDLE (MISCELLANEOUS) ×2 IMPLANT
COVER WAND RF STERILE (DRAPES) IMPLANT
DECANTER SPIKE VIAL GLASS SM (MISCELLANEOUS) ×4 IMPLANT
DRAPE IMP U-DRAPE 54X76 (DRAPES) ×2 IMPLANT
DRAPE STERI IOBAN 125X83 (DRAPES) ×2 IMPLANT
DRAPE U-SHAPE 47X51 STRL (DRAPES) ×4 IMPLANT
DRSG MEPILEX BORDER 4X8 (GAUZE/BANDAGES/DRESSINGS) ×2 IMPLANT
ELECT BLADE TIP CTD 4 INCH (ELECTRODE) ×2 IMPLANT
ELECT REM PT RETURN 15FT ADLT (MISCELLANEOUS) ×2 IMPLANT
GLOVE BIO SURGEON STRL SZ7.5 (GLOVE) ×4 IMPLANT
GLOVE BIOGEL PI IND STRL 8 (GLOVE) ×2 IMPLANT
GLOVE BIOGEL PI INDICATOR 8 (GLOVE) ×2
GOWN STRL REUS W/TWL LRG LVL3 (GOWN DISPOSABLE) ×2 IMPLANT
GOWN STRL REUS W/TWL XL LVL3 (GOWN DISPOSABLE) ×2 IMPLANT
HEAD BIOLOX HIP 36/-5 (Joint) ×1 IMPLANT
HIP BIOLOX HD 36/-5 (Joint) ×2 IMPLANT
HOLDER FOLEY CATH W/STRAP (MISCELLANEOUS) IMPLANT
INSERT TRIDENT POLY 36 0DEG (Insert) ×2 IMPLANT
KIT TURNOVER KIT A (KITS) IMPLANT
MANIFOLD NEPTUNE II (INSTRUMENTS) ×2 IMPLANT
NS IRRIG 1000ML POUR BTL (IV SOLUTION) ×2 IMPLANT
PACK ANTERIOR HIP CUSTOM (KITS) ×2 IMPLANT
PROTECTOR NERVE ULNAR (MISCELLANEOUS) ×2 IMPLANT
SCREW HEX LP 6.5X20 (Screw) ×2 IMPLANT
SHELL CLUSTERHOLE ACETABULAR 5 (Shell) ×2 IMPLANT
STEM ACCOLADE SZ 6 (Hips) ×2 IMPLANT
STRIP CLOSURE SKIN 1/2X4 (GAUZE/BANDAGES/DRESSINGS) ×2 IMPLANT
SUT MNCRL AB 4-0 PS2 18 (SUTURE) ×2 IMPLANT
SUT STRATAFIX 0 PDS 27 VIOLET (SUTURE) ×2
SUT VIC AB 0 CT1 36 (SUTURE) ×2 IMPLANT
SUT VIC AB 1 CT1 36 (SUTURE) ×2 IMPLANT
SUT VIC AB 2-0 CT1 27 (SUTURE) ×2
SUT VIC AB 2-0 CT1 TAPERPNT 27 (SUTURE) ×2 IMPLANT
SUTURE STRATFX 0 PDS 27 VIOLET (SUTURE) ×1 IMPLANT
TRAY FOLEY MTR SLVR 16FR STAT (SET/KITS/TRAYS/PACK) IMPLANT
WATER STERILE IRR 1000ML POUR (IV SOLUTION) ×4 IMPLANT
YANKAUER SUCT BULB TIP 10FT TU (MISCELLANEOUS) ×2 IMPLANT

## 2020-06-03 NOTE — Plan of Care (Signed)
  Problem: Clinical Measurements: Goal: Respiratory complications will improve Outcome: Progressing   Problem: Activity: Goal: Risk for activity intolerance will decrease Outcome: Progressing   Problem: Elimination: Goal: Will not experience complications related to bowel motility Outcome: Progressing   Problem: Skin Integrity: Goal: Risk for impaired skin integrity will decrease Outcome: Progressing   Problem: Education: Goal: Knowledge of the prescribed therapeutic regimen will improve Outcome: Progressing   Problem: Pain Management: Goal: Pain level will decrease with appropriate interventions Outcome: Progressing

## 2020-06-03 NOTE — Op Note (Signed)
06/03/2020  11:28 AM  PATIENT:  Aaron King   MRN: [id number]  PRE-OPERATIVE DIAGNOSIS:  OSTEOARTHRITIS  RIGHT HIP  POST-OPERATIVE DIAGNOSIS:  OSTEOARTHRITIS  RIGHT HIP  PROCEDURE:  Procedure(s): TOTAL HIP ARTHROPLASTY ANTERIOR APPROACH  PREOPERATIVE INDICATIONS:    Aaron King is an 74 y.o. male who has a diagnosis of Primary localized osteoarthritis of right hip and elected for surgical management after failing conservative treatment.  The risks benefits and alternatives were discussed with the patient including but not limited to the risks of nonoperative treatment, versus surgical intervention including infection, bleeding, nerve injury, periprosthetic fracture, the need for revision surgery, dislocation, leg length discrepancy, blood clots, cardiopulmonary complications, morbidity, mortality, among others, and they were willing to proceed.     OPERATIVE REPORT     SURGEON:   Renette Butters, MD    ASSISTANT:  Margy Clarks, PA-C, he was present and scrubbed throughout the case, critical for completion in a timely fashion, and for retraction, instrumentation, and closure.     ANESTHESIA:  General    COMPLICATIONS:  None.     COMPONENTS:  Stryker acolade fit femur size 6 with a 36 mm -5 head ball and an acetabular shell size 52 with a  polyethylene liner    PROCEDURE IN DETAIL:   The patient was met in the holding area and  identified.  The appropriate hip was identified and marked at the operative site.  The patient was then transported to the OR  and  placed under anesthesia per that record.  At that point, the patient was  placed in the supine position and  secured to the operating room table and all bony prominences padded. He received pre-operative antibiotics    The operative lower extremity was prepped from the iliac crest to the distal leg.  Sterile draping was performed.  Time out was performed prior to incision.      Skin incision was made just 2 cm lateral  to the ASIS  extending in line with the tensor fascia lata. Electrocautery was used to control all bleeders. I dissected down sharply to the fascia of the tensor fascia lata was confirmed that the muscle fibers beneath were running posteriorly. I then incised the fascia over the superficial tensor fascia lata in line with the incision. The fascia was elevated off the anterior aspect of the muscle the muscle was retracted posteriorly and protected throughout the case. I then used electrocautery to incise the tensor fascia lata fascia control and all bleeders. Immediately visible was the fat over top of the anterior neck and capsule.  I removed the anterior fat from the capsule and elevated the rectus muscle off of the anterior capsule. I then removed a large time of capsule. The retractors were then placed over the anterior acetabulum as well as around the superior and inferior neck.  I then made a femoral neck cut. Then used the power corkscrew to remove the femoral head from the acetabulum and thoroughly irrigated the acetabulum. I sized the femoral head.    I then exposed the deep acetabulum, cleared out any tissue including the ligamentum teres.   After adequate visualization, I excised the labrum, and then sequentially reamed.  I then impacted the acetabular implant into place using fluoroscopy for guidance.  Appropriate version and inclination was confirmed clinically matching their bony anatomy, and with fluoroscopy.  I placed a 20 mm screw in the posterior/superio position with an excellent bite.    I then placed  the polyethylene liner in place  I then adducted the leg and released the external rotators from the posterior femur allowing it to be easily delivered up lateral and anterior to the acetabulum for preparation of the femoral canal.    I then prepared the proximal femur using the cookie-cutter and then sequentially reamed and broached.  A trial broach, neck, and head was utilized, and  I reduced the hip and used floroscopy to assess the neck length and femoral implant.  I then impacted the femoral prosthesis into place into the appropriate version. The hip was then reduced and fluoroscopy confirmed appropriate position. Leg lengths were restored.  I then irrigated the hip copiously again with, and repaired the fascia with Vicryl, followed by monocryl for the subcutaneous tissue, Monocryl for the skin, Steri-Strips and sterile gauze. The patient was then awakened and returned to PACU in stable and satisfactory condition. There were no complications.  POST OPERATIVE PLAN: WBAT, DVT px: SCD's/TED, ambulation and chemical dvt px  Edmonia Lynch, MD Orthopedic Surgeon [phone]

## 2020-06-03 NOTE — Interval H&P Note (Signed)
History and Physical Interval Note:  06/03/2020 7:21 AM  Aaron King  has presented today for surgery, with the diagnosis of OSTEOARTHRITIS  RIGHT HIP.  The various methods of treatment have been discussed with the patient and family. After consideration of risks, benefits and other options for treatment, the patient has consented to  Procedure(s): TOTAL HIP ARTHROPLASTY ANTERIOR APPROACH (Right) as a surgical intervention.  The patient's history has been reviewed, patient examined, no change in status, stable for surgery.  I have reviewed the patient's chart and labs.  Questions were answered to the patient's satisfaction.     Renette Butters

## 2020-06-03 NOTE — Interval H&P Note (Signed)
History and Physical Interval Note:  06/03/2020 9:03 AM  Aaron King  has presented today for surgery, with the diagnosis of OSTEOARTHRITIS  RIGHT HIP.  The various methods of treatment have been discussed with the patient and family. After consideration of risks, benefits and other options for treatment, the patient has consented to  Procedure(s): TOTAL HIP ARTHROPLASTY ANTERIOR APPROACH (Right) as a surgical intervention.  The patient's history has been reviewed, patient examined, no change in status, stable for surgery.  I have reviewed the patient's chart and labs.  Questions were answered to the patient's satisfaction.     Renette Butters

## 2020-06-03 NOTE — Anesthesia Procedure Notes (Signed)
Spinal  Patient location during procedure: OR Start time: 06/03/2020 10:14 AM End time: 06/03/2020 10:18 AM Staffing Performed: resident/CRNA  Resident/CRNA: Montel Clock, CRNA Preanesthetic Checklist Completed: patient identified, IV checked, risks and benefits discussed, surgical consent, monitors and equipment checked, pre-op evaluation and timeout performed Spinal Block Patient position: sitting Prep: DuraPrep Patient monitoring: heart rate, cardiac monitor, continuous pulse ox and blood pressure Approach: midline Location: L3-4 Injection technique: single-shot Needle Needle type: Pencan  Needle gauge: 24 G Needle length: 10 cm Needle insertion depth: 7 cm Assessment Sensory level: T6

## 2020-06-03 NOTE — Anesthesia Postprocedure Evaluation (Signed)
Anesthesia Post Note  Patient: Aaron King  Procedure(s) Performed: TOTAL HIP ARTHROPLASTY ANTERIOR APPROACH (Right Hip)     Patient location during evaluation: Phase II Anesthesia Type: Spinal Level of consciousness: awake and alert, patient cooperative and oriented Pain management: pain level controlled Vital Signs Assessment: post-procedure vital signs reviewed and stable Respiratory status: spontaneous breathing, nonlabored ventilation and respiratory function stable Cardiovascular status: blood pressure returned to baseline and stable Postop Assessment: no apparent nausea or vomiting and patient able to bend at knees Anesthetic complications: no   No complications documented.  Last Vitals:  Vitals:   06/03/20 1245 06/03/20 1300  BP: (!) 112/59 (!) 122/47  Pulse: 54 50  Resp: 13 14  Temp:  (!) 36.4 C  SpO2: 100% 100%    Last Pain:  Vitals:   06/03/20 1300  TempSrc:   PainSc: 0-No pain                 Geno Sydnor,E. Valentina Alcoser

## 2020-06-03 NOTE — Transfer of Care (Signed)
Immediate Anesthesia Transfer of Care Note  Patient: Aaron King  Procedure(s) Performed: TOTAL HIP ARTHROPLASTY ANTERIOR APPROACH (Right Hip)  Patient Location: PACU  Anesthesia Type:Spinal  Level of Consciousness: drowsy and patient cooperative  Airway & Oxygen Therapy: Patient Spontanous Breathing and Patient connected to face mask oxygen  Post-op Assessment: Report given to RN and Post -op Vital signs reviewed and stable  Post vital signs: Reviewed and stable  Last Vitals:  Vitals Value Taken Time  BP 111/53 06/03/20 1208  Temp 36.4 C 06/03/20 1208  Pulse 59 06/03/20 1213  Resp 23 06/03/20 1213  SpO2 100 % 06/03/20 1213  Vitals shown include unvalidated device data.  Last Pain:  Vitals:   06/03/20 1208  TempSrc:   PainSc: 0-No pain         Complications: No complications documented.

## 2020-06-04 DIAGNOSIS — M1611 Unilateral primary osteoarthritis, right hip: Secondary | ICD-10-CM | POA: Diagnosis not present

## 2020-06-04 MED ORDER — HYDROCODONE-ACETAMINOPHEN 5-325 MG PO TABS: 1.0000 | ORAL_TABLET | ORAL | 0 refills | Status: DC | PRN

## 2020-06-04 MED ORDER — ASPIRIN 81 MG PO CHEW: 81.0000 mg | CHEWABLE_TABLET | Freq: Two times a day (BID) | ORAL | 0 refills | Status: AC

## 2020-06-04 MED ORDER — ONDANSETRON HCL 4 MG PO TABS: 4.0000 mg | ORAL_TABLET | Freq: Four times a day (QID) | ORAL | 0 refills | Status: DC | PRN

## 2020-06-04 NOTE — Discharge Instructions (Signed)

## 2020-06-04 NOTE — TOC Transition Note (Signed)
Transition of Care [facility]) - CM/SW Discharge Note   Patient Details  Name: Aaron King MRN: [id number] Date of Birth: January 20, 1946  [facility]) CM/SW Contact:  Lia Hopping, [place] Phone Number: 06/04/2020, 11:15 AM   Clinical Narrative:    York Ram Plan of care, see CM note.     Final next level of care: OP Rehab Barriers to Discharge: No Barriers Identified   Patient Goals and CMS Choice        Discharge Placement                       Discharge Plan and Services                DME Arranged: N/A DME Agency: NA                  Social Determinants of Health (SDOH) Interventions     Readmission Risk Interventions No flowsheet data found.

## 2020-06-04 NOTE — Evaluation (Signed)
Physical Therapy Evaluation Patient Details Name: Aaron King MRN: [id number] DOB: Mar 26, 1946 Today's Date: 06/04/2020   History of Present Illness  74 y.o. male admitted for R AA-THA on 06/03/20. PMH of COPD, EtOH, urethral stricture.  Clinical Impression  Pt has met PT goals and is ready to DC home from PT standpoint. He ambulated 106' with RW, completed stair training, and demonstrates good understanding of HEP.    Follow Up Recommendations Follow surgeon's recommendation for DC plan and follow-up therapies    Equipment Recommendations  3in1 (PT)    Recommendations for Other Services       Precautions / Restrictions Precautions Precautions: Fall Precaution Comments: denies falls in past 1 year Restrictions Weight Bearing Restrictions: No      Mobility  Bed Mobility Overal bed mobility: Modified Independent             General bed mobility comments: HOB up, used rail  Transfers Overall transfer level: Needs assistance Equipment used: Rolling walker (2 wheeled) Transfers: Sit to/from Stand Sit to Stand: Min guard         General transfer comment: VCs hand placement  Ambulation/Gait Ambulation/Gait assistance: Supervision Gait Distance (Feet): 240 Feet Assistive device: Rolling walker (2 wheeled) Gait Pattern/deviations: Step-to pattern;Decreased step length - right;Decreased step length - left;Decreased weight shift to right Gait velocity: decr   General Gait Details: VCs hand placement and sequencing  Stairs Stairs: Yes Stairs assistance: Supervision Stair Management: One rail Right;Forwards;Step to pattern Number of Stairs: 3 General stair comments: VCs sequencing, no LOB  Wheelchair Mobility    Modified Rankin (Stroke Patients Only)       Balance Overall balance assessment: Modified Independent                                           Pertinent Vitals/Pain Pain Assessment: 0-10 Pain Score: 4  Pain Location: R hip  with walking Pain Descriptors / Indicators: Sore Pain Intervention(s): Limited activity within patient's tolerance;Monitored during session;Premedicated before session;Ice applied;Repositioned    Home Living Family/patient expects to be discharged to:: Private residence   Available Help at Discharge: Neighbor   Home Access: Stairs to enter Entrance Stairs-Rails: Right Entrance Stairs-Number of Steps: 3 Home Layout: One level Home Equipment: Environmental consultant - 2 wheels;Cane - single point Additional Comments: lives alone, will DC to neighbor's home which has 3 STE    Prior Function Level of Independence: Independent with assistive device(s)         Comments: used SPC     Hand Dominance        Extremity/Trunk Assessment   Upper Extremity Assessment Upper Extremity Assessment: Overall WFL for tasks assessed    Lower Extremity Assessment Lower Extremity Assessment: RLE deficits/detail RLE Deficits / Details: hip -3/5 grossly, AAROM WFL R hip RLE Sensation: WNL RLE Coordination: WNL    Cervical / Trunk Assessment Cervical / Trunk Assessment: Normal  Communication   Communication: No difficulties  Cognition Arousal/Alertness: Awake/alert Behavior During Therapy: WFL for tasks assessed/performed Overall Cognitive Status: Within Functional Limits for tasks assessed                                        General Comments      Exercises Total Joint Exercises Ankle Circles/Pumps: AROM;Both;10 reps;Supine Quad Sets: AROM;Right;5 reps;Supine  Short Arc Quad: AROM;Right;10 reps;Supine Heel Slides: AAROM;Right;10 reps;Supine Hip ABduction/ADduction: AAROM;Right;10 reps;Supine Long Arc Quad: AROM;Right;10 reps;Seated   Assessment/Plan    PT Assessment Patent does not need any further PT services  PT Problem List         PT Treatment Interventions      PT Goals (Current goals can be found in the Care Plan section)  Acute Rehab PT Goals Patient Stated  Goal: yardwork PT Goal Formulation: All assessment and education complete, DC therapy Potential to Achieve Goals: Good    Frequency     Barriers to discharge        Co-evaluation               AM-PAC PT "6 Clicks" Mobility  Outcome Measure Help needed turning from your back to your side while in a flat bed without using bedrails?: None Help needed moving from lying on your back to sitting on the side of a flat bed without using bedrails?: A Little Help needed moving to and from a bed to a chair (including a wheelchair)?: None Help needed standing up from a chair using your arms (e.g., wheelchair or bedside chair)?: None Help needed to walk in hospital room?: None Help needed climbing 3-5 steps with a railing? : None 6 Click Score: 23    End of Session Equipment Utilized During Treatment: Gait belt Activity Tolerance: Patient tolerated treatment well;No increased pain Patient left: in chair;with call bell/phone within reach Nurse Communication: Mobility status PT Visit Diagnosis: Pain;Muscle weakness (generalized) (M62.81);Difficulty in walking, not elsewhere classified (R26.2) Pain - Right/Left: Right Pain - part of body: Hip    Time: 3888-2800 PT Time Calculation (min) (ACUTE ONLY): 29 min   Charges:   PT Evaluation $PT Eval Low Complexity: 1 Low PT Treatments $Gait Training: 8-22 mins       Blondell Reveal Kistler PT 06/04/2020  Acute Rehabilitation Services Pager [phone] Office [phone]

## 2020-06-04 NOTE — Progress Notes (Signed)
° ° °  Subjective: Patient reports pain as moderate.  Tolerating diet.  Urinating.  +Flatus.  No CP, SOB. Paraesthesia,  OOB.  Objective:   VITALS:   Vitals:   06/03/20 2019 06/04/20 0140 06/04/20 0140 06/04/20 0529  BP: (!) 143/70 (!) 132/76 (!) 132/76 (!) 140/72  Pulse: 66 54 52 45  Resp: 19 16 16 16   Temp: (!) 97.5 F (36.4 C) 98.2 F (36.8 C) 98.2 F (36.8 C) 97.7 F (36.5 C)  TempSrc: Oral Oral Oral Oral  SpO2: 100% 100% 100% 100%  Weight:      Height:       CBC Latest Ref Rng & Units 05/22/2020 07/30/2019 02/23/2019  WBC 4.0 - 10.5 K/uL 8.9 8.7 6.2  Hemoglobin 13.0 - 17.0 g/dL 12.4(L) 12.6(L) 13.6  Hematocrit 39 - 52 % 37.5(L) 36.3(L) 40.0  Platelets 150 - 400 K/uL 278 296 293   BMP Latest Ref Rng & Units 05/22/2020 07/30/2019 02/23/2019  Glucose 70 - 99 mg/dL 97 101(H) 108(H)  BUN 8 - 23 mg/dL 17 12 11   Creatinine 0.61 - 1.24 mg/dL 0.97 1.16 0.90  BUN/Creat Ratio 10 - 24 - 10 -  Sodium 135 - 145 mmol/L 138 139 144  Potassium 3.5 - 5.1 mmol/L 4.3 4.2 4.4  Chloride 98 - 111 mmol/L 99 101 105  CO2 22 - 32 mmol/L 28 24 27   Calcium 8.9 - 10.3 mg/dL 8.9 9.1 9.3   Intake/Output      07/27 0701 - 07/28 0700 07/28 0701 - 07/29 0700   P.O. 1140    I.V. (mL/kg) 2000 (19.2)    Other 250    IV Piggyback 250    Total Intake(mL/kg) 3640 (35)    Urine (mL/kg/hr) 3500    Stool 0    Blood 150    Total Output 3650    Net -10         Urine Occurrence 0 x    Stool Occurrence 0 x       Physical Exam: General: NAD.  AAO x 4 Resp: No increased wob Cardio: regular rate and rhythm ABD soft Neurologically intact MSK Neurovascularly intact Sensation intact distally Intact pulses distally Dorsiflexion/Plantar flexion intact Incision: dressing C/D/I   Assessment: 1 Day Post-Op  S/P Procedure(s) (LRB): TOTAL HIP ARTHROPLASTY ANTERIOR APPROACH (Right) by Dr. Ernesta Amble. Murphy on 06/03/2020  Principal Problem:   Primary localized osteoarthritis of right hip Active  Problems:   Alcohol abuse   Essential hypertension   CAD (coronary artery disease)   Back pain   Urethral stricture   Osteoarthritis of right hip  ADDITIONAL DIAGNOSIS:     Primary osteoarthritis, status post total hip arthroplasty  Plan: Pain: Norco, Robaxin  Incentive Spirometry Apply ice   Weight Bearing:  WBAT Dressings: Maintain Mepilex.   VTE prophylaxis: ASA 81mg  BID, SCDs, ambulation Dispo: Plan for d/c to home with home healthcare once passes PT/OT    Rachael Fee, PA-C 06/04/2020, 7:56 AM

## 2020-06-04 NOTE — Discharge Summary (Signed)
Discharge Summary  Patient ID: Aaron King MRN: [id number] DOB/AGE: [birth year] 74 y.o.  Admit date: 06/03/2020 Discharge date: 06/04/2020  Admission Diagnoses:  Primary localized osteoarthritis of right hip  Discharge Diagnoses:  Principal Problem:   Primary localized osteoarthritis of right hip Active Problems:   Alcohol abuse   Essential hypertension   CAD (coronary artery disease)   Back pain   Urethral stricture   Osteoarthritis of right hip   Past Medical History:  Diagnosis Date   Anticoagulant long-term use    plavix   CAD (coronary artery disease) cardiologist--- dr dalton mclean   a. 05/2010 NSTEMI/Cath/PCI: LM 11, LAD min irregs, LCX min irregs, OM1 99(2.5x12 Promus DES), RCA 30p, EF 65%;  b. effient d/c 2/2 to gum bleeding and epistaxis   COPD with emphysema ([place])    DOE (dyspnea on exertion)    Dyslipidemia    ETOH abuse    Gross hematuria    History of non-ST elevation myocardial infarction (NSTEMI) 05/2010   s/p  PCI and DES x1   History of prostate cancer    s/p XRT and seed implantation in May 2010   Hypertension    Nocturia    Primary localized osteoarthritis of right hip 05/13/2020   S/P drug eluting coronary stent placement 05/2010   Urethral stricture 05/13/2020   Urology says use 14 french catheter   Dr Junious Silk said to place in preop.  Call Urology if any issues in preop placing this catheter.  [phone]   Urethral stricture in male     Surgeries: Procedure(s): TOTAL HIP ARTHROPLASTY ANTERIOR APPROACH on 06/03/2020   Consultants (if any):   Discharged Condition: Improved  Hospital Course: Aaron King is an 74 y.o. male who was admitted 06/03/2020 with a diagnosis of Primary localized osteoarthritis of right hip and went to the operating room on 06/03/2020 and underwent the above named procedures.     He was given perioperative antibiotics:  Anti-infectives (From admission, onward)   Start     Dose/Rate Route Frequency  Ordered Stop   06/03/20 1600  ceFAZolin (ANCEF) IVPB 2g/100 mL premix        2 g 200 mL/hr over 30 Minutes Intravenous Every 6 hours 06/03/20 1432 06/03/20 2115   06/03/20 0600  ceFAZolin (ANCEF) 3 g in dextrose 5 % 50 mL IVPB        3 g 100 mL/hr over 30 Minutes Intravenous On call to O.R. 06/02/20 6433 06/03/20 1049    .  He was given sequential compression devices, early ambulation, and ASA 81mg  BID for DVT prophylaxis.  He benefited maximally from the hospital stay and there were no complications.    Recent vital signs:  Vitals:   06/04/20 0140 06/04/20 0529  BP: (!) 132/76 (!) 140/72  Pulse: 52 45  Resp: 16 16  Temp: 98.2 F (36.8 C) 97.7 F (36.5 C)  SpO2: 100% 100%    Recent laboratory studies:  Lab Results  Component Value Date   HGB 12.4 (L) 05/22/2020   HGB 12.6 (L) 07/30/2019   HGB 13.6 02/23/2019   Lab Results  Component Value Date   WBC 8.9 05/22/2020   PLT 278 05/22/2020   Lab Results  Component Value Date   INR 1.0 05/22/2020   Lab Results  Component Value Date   NA 138 05/22/2020   K 4.3 05/22/2020   CL 99 05/22/2020   CO2 28 05/22/2020   BUN 17 05/22/2020   CREATININE 0.97 05/22/2020  GLUCOSE 97 05/22/2020    Discharge Medications:   Allergies as of 06/04/2020      Reactions   Oxycodone Other (See Comments)   LETHARGIC   Oxycontin [oxycodone Hcl] Other (See Comments)   LETHARGIC      Medication List    STOP taking these medications   aspirin EC 81 MG tablet Replaced by: aspirin 81 MG chewable tablet   HYDROcodone-acetaminophen 7.5-325 MG tablet Commonly known as: NORCO Replaced by: HYDROcodone-acetaminophen 5-325 MG tablet     TAKE these medications   albuterol 108 (90 Base) MCG/ACT inhaler Commonly known as: VENTOLIN HFA Inhale 2 puffs into the lungs every 4 (four) hours as needed for wheezing or shortness of breath.   aspirin 81 MG chewable tablet Chew 1 tablet (81 mg total) by mouth 2 (two) times daily. Replaces:  aspirin EC 81 MG tablet   atorvastatin 40 MG tablet Commonly known as: LIPITOR Take 40 mg by mouth at bedtime.   clopidogrel 75 MG tablet Commonly known as: PLAVIX Take 1 tablet by mouth once daily   furosemide 20 MG tablet Commonly known as: LASIX Take 20 mg by mouth daily as needed for edema.   HYDROcodone-acetaminophen 5-325 MG tablet Commonly known as: NORCO/VICODIN Take 1-2 tablets by mouth every 4 (four) hours as needed for moderate pain (pain score 4-6). Replaces: HYDROcodone-acetaminophen 7.5-325 MG tablet   metoprolol succinate 25 MG 24 hr tablet Commonly known as: TOPROL-XL Take 25 mg by mouth every morning.   nitroGLYCERIN 0.4 MG SL tablet Commonly known as: NITROSTAT Place 1 tablet (0.4 mg total) under the tongue every 5 (five) minutes as needed.   ondansetron 4 MG tablet Commonly known as: ZOFRAN Take 1 tablet (4 mg total) by mouth every 6 (six) hours as needed for nausea.   tamsulosin 0.4 MG Caps capsule Commonly known as: FLOMAX Take 0.4 mg by mouth daily.       Diagnostic Studies: DG C-Arm 1-60 Min-No Report  Result Date: 06/03/2020 Fluoroscopy was utilized by the requesting physician.  No radiographic interpretation.   DG HIP OPERATIVE UNILAT W OR W/O PELVIS RIGHT  Result Date: 06/03/2020 CLINICAL DATA:  Right anterior hip replacement. EXAM: OPERATIVE RIGHT HIP (WITH PELVIS IF PERFORMED) 2 VIEWS TECHNIQUE: Fluoroscopic spot image(s) were submitted for interpretation post-operatively. COMPARISON:  Right hip radiographs 10/22/2019 FINDINGS: Spot fluoroscopic AP images of the right hip and lower pelvis demonstrate interval right total hip replacement with a screw fixed acetabular component. The hardware appears well positioned. No evidence of acute fracture or dislocation. Prostate brachytherapy seeds are noted. IMPRESSION: Interval right total hip replacement without demonstrated complication. Electronically Signed   By: Richardean Sale M.D.   On: 06/03/2020  12:01    Disposition: Discharge disposition: 01-Home or Self Care       Discharge Instructions    Call MD / Call 911   Complete by: As directed    If you experience chest pain or shortness of breath, CALL 911 and be transported to the hospital emergency room.  If you develope a fever above 101 F, pus (white drainage) or increased drainage or redness at the wound, or calf pain, call your surgeon's office.   Constipation Prevention   Complete by: As directed    Drink plenty of fluids.  Prune juice may be helpful.  You may use a stool softener, such as Colace (over the counter) 100 mg twice a day.  Use MiraLax (over the counter) for constipation as needed.   Diet - low  sodium heart healthy   Complete by: As directed    Discharge instructions   Complete by: As directed    INSTRUCTIONS AFTER JOINT REPLACEMENT   Remove items at home which could result in a fall. This includes throw rugs or furniture in walking pathways ICE to the affected joint every three hours while awake for 30 minutes at a time, for at least the first 3-5 days, and then as needed for pain and swelling.  Continue to use ice for pain and swelling. You may notice swelling that will progress down to the foot and ankle.  This is normal after surgery.  Elevate your leg when you are not up walking on it.   Continue to use the breathing machine you got in the hospital (incentive spirometer) which will help keep your temperature down.  It is common for your temperature to cycle up and down following surgery, especially at night when you are not up moving around and exerting yourself.  The breathing machine keeps your lungs expanded and your temperature down.   DIET:  As you were doing prior to hospitalization, we recommend a well-balanced diet.  DRESSING / WOUND CARE / SHOWERING  Keep the surgical dressing until follow up.  The dressing is water proof, so you can shower without any extra covering.  IF THE DRESSING FALLS OFF or  the wound gets wet inside, change the dressing with sterile gauze.  Please use good hand washing techniques before changing the dressing.  Do not use any lotions or creams on the incision until instructed by your surgeon.    ACTIVITY  Increase activity slowly as tolerated, but follow the weight bearing instructions below.   No driving for 6 weeks or until further direction given by your physician.  You cannot drive while taking narcotics.  No lifting or carrying greater than 10 lbs. until further directed by your surgeon. Avoid periods of inactivity such as sitting longer than an hour when not asleep. This helps prevent blood clots.  You may return to work once you are authorized by your doctor.     WEIGHT BEARING   Weight bearing as tolerated with assist device (walker, cane, etc) as directed, use it as long as suggested by your surgeon or therapist, typically at least 4-6 weeks.   EXERCISES  Results after joint replacement surgery are often greatly improved when you follow the exercise, range of motion and muscle strengthening exercises prescribed by your doctor. Safety measures are also important to protect the joint from further injury. Any time any of these exercises cause you to have increased pain or swelling, decrease what you are doing until you are comfortable again and then slowly increase them. If you have problems or questions, call your caregiver or physical therapist for advice.   Rehabilitation is important following a joint replacement. After just a few days of immobilization, the muscles of the leg can become weakened and shrink (atrophy).  These exercises are designed to build up the tone and strength of the thigh and leg muscles and to improve motion. Often times heat used for twenty to thirty minutes before working out will loosen up your tissues and help with improving the range of motion but do not use heat for the first two weeks following surgery (sometimes heat can  increase post-operative swelling).   These exercises can be done on a training (exercise) mat, on the floor, on a table or on a bed. Use whatever works the best and is most  comfortable for you.    Use music or television while you are exercising so that the exercises are a pleasant break in your day. This will make your life better with the exercises acting as a break in your routine that you can look forward to.   Perform all exercises about fifteen times, three times per day or as directed.  You should exercise both the operative leg and the other leg as well.  Exercises include:   Quad Sets - Tighten up the muscle on the front of the thigh (Quad) and hold for 5-10 seconds.   Straight Leg Raises - With your knee straight (if you were given a brace, keep it on), lift the leg to 60 degrees, hold for 3 seconds, and slowly lower the leg.  Perform this exercise against resistance later as your leg gets stronger.  Leg Slides: Lying on your back, slowly slide your foot toward your buttocks, bending your knee up off the floor (only go as far as is comfortable). Then slowly slide your foot back down until your leg is flat on the floor again.  Angel Wings: Lying on your back spread your legs to the side as far apart as you can without causing discomfort.  Hamstring Strength:  Lying on your back, push your heel against the floor with your leg straight by tightening up the muscles of your buttocks.  Repeat, but this time bend your knee to a comfortable angle, and push your heel against the floor.  You may put a pillow under the heel to make it more comfortable if necessary.   A rehabilitation program following joint replacement surgery can speed recovery and prevent re-injury in the future due to weakened muscles. Contact your doctor or a physical therapist for more information on knee rehabilitation.    CONSTIPATION  Constipation is defined medically as fewer than three stools per week and severe  constipation as less than one stool per week.  Even if you have a regular bowel pattern at home, your normal regimen is likely to be disrupted due to multiple reasons following surgery.  Combination of anesthesia, postoperative narcotics, change in appetite and fluid intake all can affect your bowels.   YOU MUST use at least one of the following options; they are listed in order of increasing strength to get the job done.  They are all available over the counter, and you may need to use some, POSSIBLY even all of these options:    Drink plenty of fluids (prune juice may be helpful) and high fiber foods Colace 100 mg by mouth twice a day  Senokot for constipation as directed and as needed Dulcolax (bisacodyl), take with full glass of water  Miralax (polyethylene glycol) once or twice a day as needed.  If you have tried all these things and are unable to have a bowel movement in the first 3-4 days after surgery call either your surgeon or your primary doctor.    If you experience loose stools or diarrhea, hold the medications until you stool forms back up.  If your symptoms do not get better within 1 week or if they get worse, check with your doctor.  If you experience "the worst abdominal pain ever" or develop nausea or vomiting, please contact the office immediately for further recommendations for treatment.   ITCHING:  If you experience itching with your medications, try taking only a single pain pill, or even half a pain pill at a time.  You can also  use Benadryl over the counter for itching or also to help with sleep.   TED HOSE STOCKINGS:  Use stockings on both legs until for at least 2 weeks or as directed by physician office. They may be removed at night for sleeping.  MEDICATIONS:  See your medication summary on the "After Visit Summary" that nursing will review with you.  You may have some home medications which will be placed on hold until you complete the course of blood thinner  medication.  It is important for you to complete the blood thinner medication as prescribed.  PRECAUTIONS:  If you experience chest pain or shortness of breath - call 911 immediately for transfer to the hospital emergency department.   If you develop a fever greater that 101 F, purulent drainage from wound, increased redness or drainage from wound, foul odor from the wound/dressing, or calf pain - CONTACT YOUR SURGEON.                                                   FOLLOW-UP APPOINTMENTS:  If you do not already have a post-op appointment, please call the office for an appointment to be seen by your surgeon.  Guidelines for how soon to be seen are listed in your "After Visit Summary", but are typically between 1-4 weeks after surgery.  OTHER INSTRUCTIONS:   Knee Replacement:  Do not place pillow under knee, focus on keeping the knee straight while resting. CPM instructions: 0-90 degrees, 2 hours in the morning, 2 hours in the afternoon, and 2 hours in the evening. Place foam block, curve side up under heel at all times except when in CPM or when walking.  DO NOT modify, tear, cut, or change the foam block in any way.   DENTAL ANTIBIOTICS:  In most cases prophylactic antibiotics for Dental procdeures after total joint surgery are not necessary.  Exceptions are as follows:  1. History of prior total joint infection  2. Severely immunocompromised (Organ Transplant, cancer chemotherapy, Rheumatoid biologic meds such as [place])  3. Poorly controlled diabetes (A1C &gt; 8.0, blood glucose over 200)  If you have one of these conditions, contact your surgeon for an antibiotic prescription, prior to your dental procedure.   MAKE SURE YOU:  Understand these instructions.  Get help right away if you are not doing well or get worse.    Thank you for letting us be a part of your medical care team.  It is a privilege we respect greatly.  We hope these instructions will help you stay on track  for a fast and full recovery!   Driving restrictions   Complete by: As directed    No driving for 6  weeks   Follow the hip precautions as taught in Physical Therapy   Complete by: As directed    Increase activity slowly as tolerated   Complete by: As directed    TED hose   Complete by: As directed    Use stockings (TED hose) for 2 weeks on both  leg(s).  You may remove them at night for sleeping.       Follow-up Information    Renette Butters, MD. Go on 06/18/2020.   Specialty: Orthopedic Surgery Why: Your appointment has been scheduled for 4:15.  Contact information: [place] [address] [phone]  Therapy, [place] on 06/05/2020.   Specialty: Physical Therapy Why: You are scheduled to start outpatient physical therapy at 11:00. Please arrive 15 minutes early to complete your paperwork  Contact information: [place] [address] [phone]        Renette Butters, MD In 2 weeks.   Specialty: Orthopedic Surgery Contact information: [address] [place] [address] [phone]                Signed: Rachael Fee PA-C 06/04/2020, 8:23 AM

## 2020-06-05 ENCOUNTER — Encounter (HOSPITAL_COMMUNITY): Payer: Self-pay | Admitting: Orthopedic Surgery

## 2020-06-05 DIAGNOSIS — Z96641 Presence of right artificial hip joint: Secondary | ICD-10-CM | POA: Diagnosis not present

## 2020-06-05 DIAGNOSIS — M1611 Unilateral primary osteoarthritis, right hip: Secondary | ICD-10-CM | POA: Diagnosis not present

## 2020-06-05 DIAGNOSIS — M25551 Pain in right hip: Secondary | ICD-10-CM | POA: Diagnosis not present

## 2020-06-10 DIAGNOSIS — M1611 Unilateral primary osteoarthritis, right hip: Secondary | ICD-10-CM | POA: Diagnosis not present

## 2020-06-10 DIAGNOSIS — M25551 Pain in right hip: Secondary | ICD-10-CM | POA: Diagnosis not present

## 2020-06-10 DIAGNOSIS — Z96641 Presence of right artificial hip joint: Secondary | ICD-10-CM | POA: Diagnosis not present

## 2020-06-12 DIAGNOSIS — M25551 Pain in right hip: Secondary | ICD-10-CM | POA: Diagnosis not present

## 2020-06-12 DIAGNOSIS — Z96641 Presence of right artificial hip joint: Secondary | ICD-10-CM | POA: Diagnosis not present

## 2020-06-12 DIAGNOSIS — M1611 Unilateral primary osteoarthritis, right hip: Secondary | ICD-10-CM | POA: Diagnosis not present

## 2020-06-13 ENCOUNTER — Other Ambulatory Visit: Payer: Self-pay | Admitting: Nurse Practitioner

## 2020-06-16 DIAGNOSIS — M1611 Unilateral primary osteoarthritis, right hip: Secondary | ICD-10-CM | POA: Diagnosis not present

## 2020-06-16 DIAGNOSIS — M25551 Pain in right hip: Secondary | ICD-10-CM | POA: Diagnosis not present

## 2020-06-16 DIAGNOSIS — Z96641 Presence of right artificial hip joint: Secondary | ICD-10-CM | POA: Diagnosis not present

## 2020-06-18 DIAGNOSIS — M25551 Pain in right hip: Secondary | ICD-10-CM | POA: Diagnosis not present

## 2020-06-18 DIAGNOSIS — M1611 Unilateral primary osteoarthritis, right hip: Secondary | ICD-10-CM | POA: Diagnosis not present

## 2020-06-18 DIAGNOSIS — Z96641 Presence of right artificial hip joint: Secondary | ICD-10-CM | POA: Diagnosis not present

## 2020-06-23 DIAGNOSIS — M25551 Pain in right hip: Secondary | ICD-10-CM | POA: Diagnosis not present

## 2020-06-23 DIAGNOSIS — Z96641 Presence of right artificial hip joint: Secondary | ICD-10-CM | POA: Diagnosis not present

## 2020-06-23 DIAGNOSIS — M1611 Unilateral primary osteoarthritis, right hip: Secondary | ICD-10-CM | POA: Diagnosis not present

## 2020-06-25 DIAGNOSIS — Z96641 Presence of right artificial hip joint: Secondary | ICD-10-CM | POA: Diagnosis not present

## 2020-06-25 DIAGNOSIS — M25551 Pain in right hip: Secondary | ICD-10-CM | POA: Diagnosis not present

## 2020-06-25 DIAGNOSIS — M1611 Unilateral primary osteoarthritis, right hip: Secondary | ICD-10-CM | POA: Diagnosis not present

## 2020-06-30 DIAGNOSIS — M25551 Pain in right hip: Secondary | ICD-10-CM | POA: Diagnosis not present

## 2020-06-30 DIAGNOSIS — M1611 Unilateral primary osteoarthritis, right hip: Secondary | ICD-10-CM | POA: Diagnosis not present

## 2020-06-30 DIAGNOSIS — Z96641 Presence of right artificial hip joint: Secondary | ICD-10-CM | POA: Diagnosis not present

## 2020-07-02 DIAGNOSIS — M1611 Unilateral primary osteoarthritis, right hip: Secondary | ICD-10-CM | POA: Diagnosis not present

## 2020-07-02 DIAGNOSIS — M25551 Pain in right hip: Secondary | ICD-10-CM | POA: Diagnosis not present

## 2020-07-02 DIAGNOSIS — Z96641 Presence of right artificial hip joint: Secondary | ICD-10-CM | POA: Diagnosis not present

## 2020-07-07 DIAGNOSIS — M25551 Pain in right hip: Secondary | ICD-10-CM | POA: Diagnosis not present

## 2020-07-07 DIAGNOSIS — Z96641 Presence of right artificial hip joint: Secondary | ICD-10-CM | POA: Diagnosis not present

## 2020-07-07 DIAGNOSIS — M1611 Unilateral primary osteoarthritis, right hip: Secondary | ICD-10-CM | POA: Diagnosis not present

## 2020-07-09 DIAGNOSIS — M1611 Unilateral primary osteoarthritis, right hip: Secondary | ICD-10-CM | POA: Diagnosis not present

## 2020-07-09 DIAGNOSIS — M25551 Pain in right hip: Secondary | ICD-10-CM | POA: Diagnosis not present

## 2020-07-09 DIAGNOSIS — Z96641 Presence of right artificial hip joint: Secondary | ICD-10-CM | POA: Diagnosis not present

## 2020-07-11 DIAGNOSIS — J449 Chronic obstructive pulmonary disease, unspecified: Secondary | ICD-10-CM | POA: Diagnosis not present

## 2020-07-11 DIAGNOSIS — D692 Other nonthrombocytopenic purpura: Secondary | ICD-10-CM | POA: Diagnosis not present

## 2020-07-11 DIAGNOSIS — E875 Hyperkalemia: Secondary | ICD-10-CM | POA: Diagnosis not present

## 2020-07-11 DIAGNOSIS — Z6838 Body mass index (BMI) 38.0-38.9, adult: Secondary | ICD-10-CM | POA: Diagnosis not present

## 2020-07-11 DIAGNOSIS — I700 Atherosclerosis of aorta: Secondary | ICD-10-CM | POA: Diagnosis not present

## 2020-07-11 DIAGNOSIS — Z79899 Other long term (current) drug therapy: Secondary | ICD-10-CM | POA: Diagnosis not present

## 2020-07-11 DIAGNOSIS — L853 Xerosis cutis: Secondary | ICD-10-CM | POA: Diagnosis not present

## 2020-07-11 DIAGNOSIS — I2510 Atherosclerotic heart disease of native coronary artery without angina pectoris: Secondary | ICD-10-CM | POA: Diagnosis not present

## 2020-07-11 DIAGNOSIS — E785 Hyperlipidemia, unspecified: Secondary | ICD-10-CM | POA: Diagnosis not present

## 2020-07-15 DIAGNOSIS — Z96641 Presence of right artificial hip joint: Secondary | ICD-10-CM | POA: Diagnosis not present

## 2020-07-15 DIAGNOSIS — M25551 Pain in right hip: Secondary | ICD-10-CM | POA: Diagnosis not present

## 2020-07-15 DIAGNOSIS — M1611 Unilateral primary osteoarthritis, right hip: Secondary | ICD-10-CM | POA: Diagnosis not present

## 2020-07-16 DIAGNOSIS — Z96641 Presence of right artificial hip joint: Secondary | ICD-10-CM | POA: Diagnosis not present

## 2020-07-16 DIAGNOSIS — M1611 Unilateral primary osteoarthritis, right hip: Secondary | ICD-10-CM | POA: Diagnosis not present

## 2020-07-16 DIAGNOSIS — M25551 Pain in right hip: Secondary | ICD-10-CM | POA: Diagnosis not present

## 2020-07-21 DIAGNOSIS — M1611 Unilateral primary osteoarthritis, right hip: Secondary | ICD-10-CM | POA: Diagnosis not present

## 2020-07-28 NOTE — Progress Notes (Signed)
CARDIOLOGY OFFICE NOTE  Date:  08/06/2020    Aaron King Date of Birth: [birth year] Medical Record #[id number]  PCP:  Leonides Sake, MD  Cardiologist:  Servando Snare    Chief Complaint  Patient presents with  . Follow-up    History of Present Illness: Aaron King is a 74 y.o. male who presents today for a 6 month check. Former patient of Dr. Claris Gladden.Primarily follows with me.  He has ahistory of CAD s/p NSTEMI in 7/11. Hehad PCI with DES to OM at the time of his MI. Had just single vessel disease noted at that time.He was admitted in 9/14 with mid-scapular upper back pain. He had similar pain with his MI. The pain was worse with cough and with certain arm movements. It was reproduced by palpation. It lasted for about a day total. Cardiac enzymes remained negative. The patient was thought to have noncardiac chest pain and was sent home.Other issues include obesity, prostate CA, HTN and HLD. Apparently hadnot been able to tolerate low dose aspirin in the past according toa prior note from Dr. Aundra Dubin.  When I saw himback inSeptember of 2018- he wasdoing well. He has remained on Plavix therapy solely. He had had a CT by GU - lung nodule noted - he asked me to follow which we have - this has also demonstrated extensive CAD noted as well.He has not required any additional CT of the chest since October of 2019 when clearing was noted.When seen back in March 2020 - having lots of GU issues - cardiac status ok. I cleared him for his surgery - he had had a low risk Myoview 6 months prior. We did stop his Plavix and used aspirin for 5 days.Last seen in March - he was doing well - needed to have hip replacement. Lots of nocturia noted - he was planning to see GU. On Plavix as monotherapy.   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  Comes in today. Here alone. He has had his surgery for his hip - that went well. Just released a few weeks ago and finished with  PT. Some stable DOE - this is unchanged. BP has been good at home. No chest pain. Not dizzy. No falls. Tolerating his medicines. No real concerns.    Past Medical History:  Diagnosis Date  . Anticoagulant long-term use    plavix  . CAD (coronary artery disease) cardiologist--- dr dalton mclean   a. 05/2010 NSTEMI/Cath/PCI: LM 20, LAD min irregs, LCX min irregs, OM1 99(2.5x12 Promus DES), RCA 30p, EF 65%;  b. effient d/c 2/2 to gum bleeding and epistaxis  . COPD with emphysema ([place])   . DOE (dyspnea on exertion)   . Dyslipidemia   . ETOH abuse   . Gross hematuria   . History of non-ST elevation myocardial infarction (NSTEMI) 05/2010   s/p  PCI and DES x1  . History of prostate cancer    s/p XRT and seed implantation in May 2010  . Hypertension   . Nocturia   . Primary localized osteoarthritis of right hip 05/13/2020  . S/P drug eluting coronary stent placement 05/2010  . Urethral stricture 05/13/2020   Urology says use 14 french catheter   Dr Junious Silk said to place in preop.  Call Urology if any issues in preop placing this catheter.  [phone]  . 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 APPENDECTOMY  1960  .  COLONOSCOPY WITH PROPOFOL  last one 2017  . CORONARY ANGIOPLASTY WITH STENT PLACEMENT  05-15-2010  DR CRENSHAW   PCI W/ DRUG-ELUTING STENT X1 IN THE FIRST OBTUSE MARGINAL BRANCH OF CIRCUMFLEX ARTERY/ NON-STEMI/  EF 65%  . CYSTOSCOPY WITH URETHRAL DILATATION N/A 03/06/2019   Procedure: CYSTOSCOPY WITH URETHRAL STRICTURE DILATATION, FOLEY CATHETER PLACEMENT;  Surgeon: Festus Aloe, MD;  Location: [facility];  Service: Urology;  Laterality: N/A;  . MASS EXCISION  08/23/2012   Procedure: EXCISION MASS;  Surgeon: Leighton Ruff, MD;  [facility];  Service: General;  Laterality: Left;  excisional biopsy left thigh mass  . RADIOACTIVE PROSTATE SEED IMPLANTS  03-31-2009   PROSTATE CANCER  . REPAIR RIGHT INGUINAL HERNIA   1980  . TOTAL HIP ARTHROPLASTY Right 06/03/2020   Procedure: TOTAL HIP ARTHROPLASTY ANTERIOR APPROACH;  Surgeon: Renette Butters, MD;  Location: [facility];  Service: Orthopedics;  Laterality: Right;     Medications: Current Meds  Medication Sig  . albuterol (PROVENTIL HFA;VENTOLIN HFA) 108 (90 Base) MCG/ACT inhaler Inhale 2 puffs into the lungs every 4 (four) hours as needed for wheezing or shortness of breath.  Marland Kitchen atorvastatin (LIPITOR) 40 MG tablet Take 40 mg by mouth at bedtime.  . clopidogrel (PLAVIX) 75 MG tablet Take 1 tablet by mouth once daily  . furosemide (LASIX) 20 MG tablet Take 20 mg by mouth daily as needed for edema.   . lovastatin (MEVACOR) 40 MG tablet Take 40 mg by mouth daily.  . metoprolol succinate (TOPROL-XL) 25 MG 24 hr tablet Take 25 mg by mouth every morning.  . nitroGLYCERIN (NITROSTAT) 0.4 MG SL tablet Place 1 tablet (0.4 mg total) under the tongue every 5 (five) minutes as needed.  . Tamsulosin HCl (FLOMAX) 0.4 MG CAPS Take 0.4 mg by mouth daily.   Marland Kitchen triamcinolone cream (KENALOG) 0.1 % Apply topically 2 (two) times daily.     Allergies: Allergies  Allergen Reactions  . Oxycodone Other (See Comments)    LETHARGIC  . Oxycontin [Oxycodone Hcl] Other (See Comments)    LETHARGIC    Social History: The patient  reports that he quit smoking about 10 years ago. His smoking use included cigarettes. He has a 60.00 pack-year smoking history. He has never used smokeless tobacco. He reports current alcohol use of about 14.0 standard drinks of alcohol per week. He reports that he does not use drugs.   Family History: The patient's family history includes Congestive Heart Failure in his father; Heart attack in his mother; Heart disease in his father and mother; Prostate cancer in his brother.   Review of Systems: Please see the history of present illness.   All other systems are reviewed and negative.   Physical Exam: VS:  BP 140/82   Pulse 62   Ht 5' 6.5" (1.689  m)   Wt 235 lb (106.6 kg)   SpO2 99%   BMI 37.36 kg/m  .  BMI Body mass index is 37.36 kg/m.  Wt Readings from Last 3 Encounters:  08/06/20 235 lb (106.6 kg)  06/03/20 (!) 229 lb 3.2 oz (104 kg)  05/22/20 229 lb 3.2 oz (104 kg)    General: Pleasant. Obese. Alert and in no acute distress.   Cardiac: Regular rate and rhythm. Heart tones are distant. No edema.  Respiratory:  Lungs are clear to auscultation bilaterally with normal work of breathing.  GI: Soft and nontender.  MS: No deformity or atrophy. Gait and ROM intact.  Skin: Warm and  dry. Color is normal.  Neuro:  Strength and sensation are intact and no gross focal deficits noted.  Psych: Alert, appropriate and with normal affect.   LABORATORY DATA:  EKG:  EKG is not ordered today.    Lab Results  Component Value Date   WBC 8.9 05/22/2020   HGB 12.4 (L) 05/22/2020   HCT 37.5 (L) 05/22/2020   PLT 278 05/22/2020   GLUCOSE 97 05/22/2020   CHOL 132 05/26/2016   TRIG 121 05/26/2016   HDL 72 05/26/2016   LDLCALC 36 05/26/2016   ALT 18 05/22/2020   AST 34 05/22/2020   NA 138 05/22/2020   K 4.3 05/22/2020   CL 99 05/22/2020   CREATININE 0.97 05/22/2020   BUN 17 05/22/2020   CO2 28 05/22/2020   INR 1.0 05/22/2020       BNP (last 3 results) No results for input(s): BNP in the last 8760 hours.  ProBNP (last 3 results) No results for input(s): PROBNP in the last 8760 hours.   Other Studies Reviewed Today:  MyoviewStudy Highlights10/2019   The left ventricular ejection fraction is hyperdynamic (>65%).  Nuclear stress EF: 67%.  There was no ST segment deviation noted during stress.  The study is normal.  This is a low risk study.     Assessment/Plan:  1. Known CAD - remote PCI/DES for single vessel CAD from 2011 - has known residual disease - on Plavix as monotherapy. No worrisome symptoms. Struggles with CV risk factor modification.   2. Prior hip surgery - hopefully will be able to be more  active.   3. HLD - on statin - labs by PCP noted off [facility] - would continue current regimen.   4. HTN - better BP control at home. No changes made today and would continue with current regimen.   5. Obesity - he struggles with this.   Current medicines are reviewed with the patient today.  The patient does not have concerns regarding medicines other than what has been noted above.  The following changes have been made:  See above.  Labs/ tests ordered today include:   No orders of the defined types were placed in this encounter.    Disposition:   FU with Dr.Skains as new patient in 6 months.     Patient is agreeable to this plan and will call if any problems develop in the interim.   SignedTruitt Merle, NP  08/06/2020 10:35 AM  [facility] [address] [place], [place]  [address] Phone: [phone] Fax: [phone]

## 2020-08-06 ENCOUNTER — Ambulatory Visit (INDEPENDENT_AMBULATORY_CARE_PROVIDER_SITE_OTHER): Payer: Medicare Other | Admitting: Nurse Practitioner

## 2020-08-06 ENCOUNTER — Encounter: Payer: Self-pay | Admitting: Nurse Practitioner

## 2020-08-06 ENCOUNTER — Other Ambulatory Visit: Payer: Self-pay

## 2020-08-06 VITALS — BP 140/82 | HR 62 | Ht 66.5 in | Wt 235.0 lb

## 2020-08-06 DIAGNOSIS — I2510 Atherosclerotic heart disease of native coronary artery without angina pectoris: Secondary | ICD-10-CM

## 2020-08-06 DIAGNOSIS — E7800 Pure hypercholesterolemia, unspecified: Secondary | ICD-10-CM

## 2020-08-06 DIAGNOSIS — I10 Essential (primary) hypertension: Secondary | ICD-10-CM

## 2020-08-06 NOTE — Patient Instructions (Addendum)
After Visit Summary:  We will be checking the following labs today - NONE   Medication Instructions:    Continue with your current medicines.    If you need a refill on your cardiac medications before your next appointment, please call your pharmacy.     Testing/Procedures To Be Arranged:  N/A  Follow-Up:   See Dr. Marlou Porch as new patient in 6 months.     At [facility], you and your health needs are our priority.  As part of our continuing mission to provide you with exceptional heart care, we have created designated Provider Care Teams.  These Care Teams include your primary Cardiologist (physician) and Advanced Practice Providers (APPs -  Physician Assistants and Nurse Practitioners) who all work together to provide you with the care you need, when you need it.  Special Instructions:  . Stay safe, wash your hands for at least 20 seconds and wear a mask when needed.  . It was good to talk with you today.    Call the [place] office at [phone] if you have any questions, problems or concerns.

## 2020-09-03 DIAGNOSIS — Z87891 Personal history of nicotine dependence: Secondary | ICD-10-CM | POA: Diagnosis not present

## 2020-09-09 DIAGNOSIS — S51801A Unspecified open wound of right forearm, initial encounter: Secondary | ICD-10-CM | POA: Diagnosis not present

## 2020-09-09 DIAGNOSIS — R0781 Pleurodynia: Secondary | ICD-10-CM | POA: Diagnosis not present

## 2020-09-09 DIAGNOSIS — M5459 Other low back pain: Secondary | ICD-10-CM | POA: Diagnosis not present

## 2020-09-09 DIAGNOSIS — Z23 Encounter for immunization: Secondary | ICD-10-CM | POA: Diagnosis not present

## 2020-09-09 DIAGNOSIS — Z6839 Body mass index (BMI) 39.0-39.9, adult: Secondary | ICD-10-CM | POA: Diagnosis not present

## 2020-09-10 DIAGNOSIS — R0781 Pleurodynia: Secondary | ICD-10-CM | POA: Diagnosis not present

## 2020-09-10 DIAGNOSIS — M545 Low back pain, unspecified: Secondary | ICD-10-CM | POA: Diagnosis not present

## 2020-09-10 DIAGNOSIS — M47816 Spondylosis without myelopathy or radiculopathy, lumbar region: Secondary | ICD-10-CM | POA: Diagnosis not present

## 2020-09-10 DIAGNOSIS — M533 Sacrococcygeal disorders, not elsewhere classified: Secondary | ICD-10-CM | POA: Diagnosis not present

## 2020-09-10 DIAGNOSIS — I700 Atherosclerosis of aorta: Secondary | ICD-10-CM | POA: Diagnosis not present

## 2020-09-10 DIAGNOSIS — Z96641 Presence of right artificial hip joint: Secondary | ICD-10-CM | POA: Diagnosis not present

## 2020-10-15 DIAGNOSIS — R8271 Bacteriuria: Secondary | ICD-10-CM | POA: Diagnosis not present

## 2020-10-15 DIAGNOSIS — N35012 Post-traumatic membranous urethral stricture: Secondary | ICD-10-CM | POA: Diagnosis not present

## 2020-10-15 DIAGNOSIS — Z8546 Personal history of malignant neoplasm of prostate: Secondary | ICD-10-CM | POA: Diagnosis not present

## 2020-10-16 DIAGNOSIS — H25813 Combined forms of age-related cataract, bilateral: Secondary | ICD-10-CM | POA: Diagnosis not present

## 2020-11-06 DIAGNOSIS — Z23 Encounter for immunization: Secondary | ICD-10-CM | POA: Diagnosis not present

## 2020-12-29 DIAGNOSIS — R8271 Bacteriuria: Secondary | ICD-10-CM | POA: Diagnosis not present

## 2020-12-29 DIAGNOSIS — N35012 Post-traumatic membranous urethral stricture: Secondary | ICD-10-CM | POA: Diagnosis not present

## 2020-12-29 DIAGNOSIS — R3912 Poor urinary stream: Secondary | ICD-10-CM | POA: Diagnosis not present

## 2021-01-14 DIAGNOSIS — I10 Essential (primary) hypertension: Secondary | ICD-10-CM | POA: Diagnosis not present

## 2021-01-14 DIAGNOSIS — E7800 Pure hypercholesterolemia, unspecified: Secondary | ICD-10-CM | POA: Diagnosis not present

## 2021-01-14 DIAGNOSIS — I700 Atherosclerosis of aorta: Secondary | ICD-10-CM | POA: Diagnosis not present

## 2021-01-14 DIAGNOSIS — J439 Emphysema, unspecified: Secondary | ICD-10-CM | POA: Diagnosis not present

## 2021-01-14 DIAGNOSIS — Z6839 Body mass index (BMI) 39.0-39.9, adult: Secondary | ICD-10-CM | POA: Diagnosis not present

## 2021-01-14 DIAGNOSIS — R339 Retention of urine, unspecified: Secondary | ICD-10-CM | POA: Diagnosis not present

## 2021-01-14 DIAGNOSIS — Z79899 Other long term (current) drug therapy: Secondary | ICD-10-CM | POA: Diagnosis not present

## 2021-01-14 DIAGNOSIS — D692 Other nonthrombocytopenic purpura: Secondary | ICD-10-CM | POA: Diagnosis not present

## 2021-01-22 ENCOUNTER — Other Ambulatory Visit: Payer: Self-pay

## 2021-01-22 ENCOUNTER — Ambulatory Visit (INDEPENDENT_AMBULATORY_CARE_PROVIDER_SITE_OTHER): Payer: Medicare Other | Admitting: Cardiology

## 2021-01-22 ENCOUNTER — Encounter: Payer: Self-pay | Admitting: Cardiology

## 2021-01-22 VITALS — BP 130/70 | HR 60 | Ht 66.5 in | Wt 243.0 lb

## 2021-01-22 DIAGNOSIS — M79605 Pain in left leg: Secondary | ICD-10-CM

## 2021-01-22 DIAGNOSIS — I2510 Atherosclerotic heart disease of native coronary artery without angina pectoris: Secondary | ICD-10-CM | POA: Diagnosis not present

## 2021-01-22 DIAGNOSIS — M79604 Pain in right leg: Secondary | ICD-10-CM | POA: Diagnosis not present

## 2021-01-22 DIAGNOSIS — E7800 Pure hypercholesterolemia, unspecified: Secondary | ICD-10-CM | POA: Diagnosis not present

## 2021-01-22 NOTE — Progress Notes (Signed)
Cardiology Office Note:    Date:  01/22/2021   ID:  Aaron King, Aaron King 1946/08/30, MRN [id number]  PCP:  Aaron Sake, MD   Aaron King  Cardiologist:  Aaron Furbish, MD  Advanced Practice Provider:  No care team member to display Electrophysiologist:  None       Referring MD: Aaron Sake, MD     History of Present Illness:    Aaron King is a 75 y.o. male here for follow-up of coronary artery disease status post non-ST elevation myocardial infarction in 2011.  Had PCI to obtuse marginal at that time.  Drug-eluting stent.  He was admitted again in 2014 with mid scapular upper back pain.  Cardiac enzymes were negative.  To have noncardiac chest pain.  Has seen Dr. Aundra King as well as Aaron King in the past.  In 2019 had stress test which overall was low risk.  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  Most recent surgery was hip surgery.  Doing well.  Past Medical History:  Diagnosis Date  . Anticoagulant long-term use    plavix  . CAD (coronary artery disease) cardiologist--- dr dalton mclean   a. 05/2010 NSTEMI/Cath/PCI: LM 20, LAD min irregs, LCX min irregs, OM1 99(2.5x12 Promus DES), RCA 30p, EF 65%;  b. effient d/c 2/2 to gum bleeding and epistaxis  . COPD with emphysema ([place])   . DOE (dyspnea on exertion)   . Dyslipidemia   . ETOH abuse   . Gross hematuria   . History of non-ST elevation myocardial infarction (NSTEMI) 05/2010   s/p  PCI and DES x1  . History of prostate cancer    s/p XRT and seed implantation in May 2010  . Hypertension   . Nocturia   . Primary localized osteoarthritis of right hip 05/13/2020  . S/P drug eluting coronary stent placement 05/2010  . Urethral stricture 05/13/2020   Urology says use 14 french catheter   Dr Junious Silk said to place in preop.  Call Urology if any issues in preop placing this catheter.  [phone]  . Urethral stricture in male     Past Surgical History:  Procedure Laterality  Date  . APPENDECTOMY  1960  . COLONOSCOPY WITH PROPOFOL  last one 2017  . CORONARY ANGIOPLASTY WITH STENT PLACEMENT  05-15-2010  DR CRENSHAW   PCI W/ DRUG-ELUTING STENT X1 IN THE FIRST OBTUSE MARGINAL BRANCH OF CIRCUMFLEX ARTERY/ NON-STEMI/  EF 65%  . CYSTOSCOPY WITH URETHRAL DILATATION N/A 03/06/2019   Procedure: CYSTOSCOPY WITH URETHRAL STRICTURE DILATATION, FOLEY CATHETER PLACEMENT;  Surgeon: Festus Aloe, MD;  Location: [facility];  Service: Urology;  Laterality: N/A;  . MASS EXCISION  08/23/2012   Procedure: EXCISION MASS;  Surgeon: Leighton Ruff, MD;  [facility];  Service: General;  Laterality: Left;  excisional biopsy left thigh mass  . RADIOACTIVE PROSTATE SEED IMPLANTS  03-31-2009   PROSTATE CANCER  . REPAIR RIGHT INGUINAL HERNIA  1980  . TOTAL HIP ARTHROPLASTY Right 06/03/2020   Procedure: TOTAL HIP ARTHROPLASTY ANTERIOR APPROACH;  Surgeon: Renette Butters, MD;  Location: [facility];  Service: Orthopedics;  Laterality: Right;    Current Medications: Current Meds  Medication Sig  . albuterol (PROVENTIL HFA;VENTOLIN HFA) 108 (90 Base) MCG/ACT inhaler Inhale 2 puffs into the lungs every 4 (four) hours as needed for wheezing or shortness of breath.  . clopidogrel (PLAVIX) 75 MG tablet Take 1 tablet by mouth once daily  .  furosemide (LASIX) 20 MG tablet Take 20 mg by mouth daily as needed for edema.   . lovastatin (MEVACOR) 40 MG tablet Take 40 mg by mouth daily.  . metoprolol succinate (TOPROL-XL) 25 MG 24 hr tablet Take 25 mg by mouth every morning.  . nitroGLYCERIN (NITROSTAT) 0.4 MG SL tablet Place 1 tablet (0.4 mg total) under the tongue every 5 (five) minutes as needed.  . Tamsulosin HCl (FLOMAX) 0.4 MG CAPS Take 0.4 mg by mouth daily.  Marland Kitchen triamcinolone cream (KENALOG) 0.1 % Apply topically 2 (two) times daily.     Allergies:   Oxycodone and Oxycontin [oxycodone hcl]   Social History   Socioeconomic History  . Marital status: Divorced    Spouse  name: Not on file  . Number of children: 0  . Years of education: Not on file  . Highest education level: Not on file  Occupational History  . 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 Smoking status: Former Smoker    Packs/day: 2.00    Years: 30.00    Pack years: 60.00    Types: Cigarettes    Quit date: 05/14/2010    Years since quitting: 10.7  . Smokeless tobacco: Never Used  Vaping Use  . Vaping Use: Never used  Substance and Sexual Activity  . Alcohol use: Yes    Alcohol/week: 14.0 standard drinks    Types: 14 Cans of beer per week    Comment: daily  . Drug use: No  . Sexual activity: Not on file  Other Topics Concern  . Not on file  Social History Narrative   Lives in [place].  Retired from [employer].   Social Determinants of Health   Financial Resource Strain: Not on file  Food Insecurity: Not on file  Transportation Needs: Not on file  Physical Activity: Not on file  Stress: Not on file  Social Connections: Not on file     Family History: The patient's family history includes Congestive Heart Failure in his father; Heart attack in his mother; Heart disease in his father and mother; Prostate cancer in his brother.  ROS:   Please see the history of present illness.     All other systems reviewed and are negative.  EKGs/Labs/Other Studies Reviewed:    The following studies were reviewed today:  MyoviewStudy Highlights10/2019   The left ventricular ejection fraction is hyperdynamic (>65%).  Nuclear stress EF: 67%.  There was no ST segment deviation noted during stress.  The study is normal.  This is a low risk study.     EKG:  EKG is  ordered today.  The ekg ordered today demonstrates sinus rhythm 60 with no other abnormalities.  Recent Labs: 05/22/2020: ALT 18; BUN 17; Creatinine, Ser 0.97; Hemoglobin 12.4; Platelets 278; Potassium 4.3; Sodium 138  Recent  Lipid Panel    Component Value Date/Time   CHOL 132 05/26/2016 1515   TRIG 121 05/26/2016 1515   HDL 72 05/26/2016 1515   CHOLHDL 1.8 05/26/2016 1515   VLDL 24 05/26/2016 1515   LDLCALC 36 05/26/2016 1515     Risk Assessment/Calculations:      Physical Exam:    VS:  BP 130/70 (BP Location: Left Arm, Patient Position: Sitting, Cuff Size: Normal)   Pulse 60   Ht 5' 6.5" (1.689 m)   Wt 243 lb (110.2 kg)   SpO2 96%   BMI 38.63 kg/m  Wt Readings from Last 3 Encounters:  01/22/21 243 lb (110.2 kg)  08/06/20 235 lb (106.6 kg)  06/03/20 (!) 229 lb 3.2 oz (104 kg)     GEN: Well nourished, well developed in no acute distress HEENT: Normal NECK: No JVD; No carotid bruits LYMPHATICS: No lymphadenopathy CARDIAC: RRR, no murmurs, rubs, gallops RESPIRATORY:  Clear to auscultation without rales, wheezing or rhonchi  ABDOMEN: Soft, non-tender, non-distended MUSCULOSKELETAL:  No edema; No deformity  SKIN: Warm and dry NEUROLOGIC:  Alert and oriented x 3 PSYCHIATRIC:  Normal affect   ASSESSMENT:    1. Coronary artery disease involving native coronary artery of native heart without angina pectoris   2. Pure hypercholesterolemia   3. Pain in both lower extremities    PLAN:    In order of problems listed above:  Coronary Artery disease status post MI in 2011 -PCI obtuse marginal branch with drug-eluting stent.  Doing well.  No anginal symptoms.  Now on Plavix monotherapy.  Hyperlipidemia -Statin.  LDL goal less than 70.  On rosuvastatin or Crestor 20 mg a day now.  Essential hypertension -Has been having better blood pressure control at home.  Sometimes elevated in doctor's office.  History of prostate cancer status post radiation therapy and seed implantation in 2010 -Now having urinary hesitancy's.  Seeing Dr. Junious Silk.  Former smoker -Quit after MI in 2011.  Retired Materials engineer pain -Burning sensation, we will check lower extremity ABIs/vascular  studies especially given his underlying prior CAD.  Could be neuropathy.   Medication Adjustments/Labs and Tests Ordered: Current medicines are reviewed at length with the patient today.  Concerns regarding medicines are outlined above.  Orders Placed This Encounter  Procedures  . EKG 12-Lead  . VAS [place] LOWER EXTREMITY ARTERIAL DUPLEX   No orders of the defined types were placed in this encounter.   Patient Instructions  Medication Instructions:  The current medical regimen is effective;  continue present plan and medications.  *If you need a refill on your cardiac medications before your next appointment, please call your pharmacy*  Testing/Procedures: Your physician has requested that you have a lower extremity arterial exercise duplex. During this test, exercise and ultrasound are used to evaluate arterial blood flow in the legs. Allow one hour for this exam. There are no restrictions or special instructions.  Follow-Up: At [facility], you and your health needs are our priority.  As part of our continuing mission to provide you with exceptional heart care, we have created designated Provider Care Teams.  These Care Teams include your primary Cardiologist (physician) and Advanced Practice Providers (APPs -  Physician Assistants and Nurse Practitioners) who all work together to provide you with the care you need, when you need it.  We recommend signing up for the patient portal called "MyChart".  Sign up information is provided on this After Visit Summary.  MyChart is used to connect with patients for Virtual Visits (Telemedicine).  Patients are able to view lab/test results, encounter notes, upcoming appointments, etc.  Non-urgent messages can be sent to your provider as well.   To learn more about what you can do with MyChart, go to [email or web address].    Your next appointment:   12 month(s)  The format for your next appointment:   In Person  Provider:   Candee Furbish,  MD   Thank you for choosing [facility]!!        Signed, Aaron Furbish, MD  01/22/2021 12:07 PM  [facility]

## 2021-01-22 NOTE — Patient Instructions (Signed)
Medication Instructions:  The current medical regimen is effective;  continue present plan and medications.  *If you need a refill on your cardiac medications before your next appointment, please call your pharmacy*  Testing/Procedures: Your physician has requested that you have a lower extremity arterial exercise duplex. During this test, exercise and ultrasound are used to evaluate arterial blood flow in the legs. Allow one hour for this exam. There are no restrictions or special instructions.  Follow-Up: At [facility], you and your health needs are our priority.  As part of our continuing mission to provide you with exceptional heart care, we have created designated Provider Care Teams.  These Care Teams include your primary Cardiologist (physician) and Advanced Practice Providers (APPs -  Physician Assistants and Nurse Practitioners) who all work together to provide you with the care you need, when you need it.  We recommend signing up for the patient portal called "MyChart".  Sign up information is provided on this After Visit Summary.  MyChart is used to connect with patients for Virtual Visits (Telemedicine).  Patients are able to view lab/test results, encounter notes, upcoming appointments, etc.  Non-urgent messages can be sent to your provider as well.   To learn more about what you can do with MyChart, go to [email or web address].    Your next appointment:   12 month(s)  The format for your next appointment:   In Person  Provider:   Candee Furbish, MD   Thank you for choosing [facility]!!

## 2021-01-23 DIAGNOSIS — N35012 Post-traumatic membranous urethral stricture: Secondary | ICD-10-CM | POA: Diagnosis not present

## 2021-01-27 ENCOUNTER — Other Ambulatory Visit: Payer: Self-pay | Admitting: Cardiology

## 2021-01-27 DIAGNOSIS — M79605 Pain in left leg: Secondary | ICD-10-CM

## 2021-02-02 ENCOUNTER — Other Ambulatory Visit: Payer: Self-pay

## 2021-02-02 ENCOUNTER — Ambulatory Visit (HOSPITAL_COMMUNITY)
Admission: RE | Admit: 2021-02-02 | Discharge: 2021-02-02 | Disposition: A | Discharge status: Home / Self Care | Payer: Medicare Other | Source: Ambulatory Visit | Attending: Cardiology | Admitting: Cardiology

## 2021-02-02 ENCOUNTER — Other Ambulatory Visit: Payer: Self-pay | Admitting: Cardiology

## 2021-02-02 DIAGNOSIS — M79604 Pain in right leg: Secondary | ICD-10-CM | POA: Diagnosis not present

## 2021-02-02 DIAGNOSIS — M79672 Pain in left foot: Secondary | ICD-10-CM

## 2021-02-02 DIAGNOSIS — M79605 Pain in left leg: Secondary | ICD-10-CM | POA: Diagnosis not present

## 2021-02-02 DIAGNOSIS — M79671 Pain in right foot: Secondary | ICD-10-CM

## 2021-02-02 DIAGNOSIS — R202 Paresthesia of skin: Secondary | ICD-10-CM

## 2021-03-02 ENCOUNTER — Other Ambulatory Visit: Payer: Self-pay | Admitting: *Deleted

## 2021-03-02 MED ORDER — CLOPIDOGREL BISULFATE 75 MG PO TABS: 1.0000 | ORAL_TABLET | Freq: Every day | ORAL | 3 refills | Status: DC

## 2021-04-22 DIAGNOSIS — N35012 Post-traumatic membranous urethral stricture: Secondary | ICD-10-CM | POA: Diagnosis not present

## 2021-05-13 DIAGNOSIS — M1611 Unilateral primary osteoarthritis, right hip: Secondary | ICD-10-CM | POA: Diagnosis not present

## 2021-07-17 DIAGNOSIS — I10 Essential (primary) hypertension: Secondary | ICD-10-CM | POA: Diagnosis not present

## 2021-07-17 DIAGNOSIS — E7800 Pure hypercholesterolemia, unspecified: Secondary | ICD-10-CM | POA: Diagnosis not present

## 2021-07-17 DIAGNOSIS — Z1331 Encounter for screening for depression: Secondary | ICD-10-CM | POA: Diagnosis not present

## 2021-07-17 DIAGNOSIS — Z139 Encounter for screening, unspecified: Secondary | ICD-10-CM | POA: Diagnosis not present

## 2021-07-17 DIAGNOSIS — Z79899 Other long term (current) drug therapy: Secondary | ICD-10-CM | POA: Diagnosis not present

## 2021-07-17 DIAGNOSIS — J449 Chronic obstructive pulmonary disease, unspecified: Secondary | ICD-10-CM | POA: Diagnosis not present

## 2021-07-17 DIAGNOSIS — Z9181 History of falling: Secondary | ICD-10-CM | POA: Diagnosis not present

## 2021-07-17 DIAGNOSIS — R202 Paresthesia of skin: Secondary | ICD-10-CM | POA: Diagnosis not present

## 2021-07-17 DIAGNOSIS — I700 Atherosclerosis of aorta: Secondary | ICD-10-CM | POA: Diagnosis not present

## 2021-07-22 DIAGNOSIS — R202 Paresthesia of skin: Secondary | ICD-10-CM | POA: Diagnosis not present

## 2021-07-22 DIAGNOSIS — M4696 Unspecified inflammatory spondylopathy, lumbar region: Secondary | ICD-10-CM | POA: Diagnosis not present

## 2021-07-22 DIAGNOSIS — R200 Anesthesia of skin: Secondary | ICD-10-CM | POA: Diagnosis not present

## 2021-07-22 DIAGNOSIS — M47816 Spondylosis without myelopathy or radiculopathy, lumbar region: Secondary | ICD-10-CM | POA: Diagnosis not present

## 2021-08-19 DIAGNOSIS — N35011 Post-traumatic bulbous urethral stricture: Secondary | ICD-10-CM | POA: Diagnosis not present

## 2021-09-10 ENCOUNTER — Telehealth: Payer: Self-pay | Admitting: Cardiology

## 2021-09-10 ENCOUNTER — Other Ambulatory Visit: Payer: Self-pay | Admitting: Urology

## 2021-09-10 NOTE — Telephone Encounter (Signed)
Agree with holding Plavix before surgery Candee Furbish, MD

## 2021-09-10 NOTE — Telephone Encounter (Signed)
Dr. Marlou Porch, We are asked to hold plavis in the this pt with history of CAD with prior PCI in 2011, nonischemic nuclear stress test in 2019.  In the past, he has held plavix and started taking ASA prior to surgeries. Do you agree with this plan moving forward for surgeries requesting plavix hold?

## 2021-09-10 NOTE — Telephone Encounter (Signed)
   [facility] Pre-operative Risk Assessment    Patient Name: Aaron King  DOB: [birth year] MRN: [id number]  HEARTCARE STAFF:  - IMPORTANT!!!!!! Under Visit Info/Reason for Call, type in Other and utilize the format Clearance MM/DD/YY or Clearance TBD. Do not use dashes or single digits. - Please review there is not already an duplicate clearance open for this procedure. - If request is for dental extraction, please clarify the # of teeth to be extracted. - If the patient is currently at the dentist's office, call Pre-Op Callback Staff (MA/nurse) to input urgent request.  - If the patient is not currently in the dentist office, please route to the Pre-Op pool.  Request for surgical clearance:  What type of surgery is being performed? Urethral balloon  dilation   When is this surgery scheduled? TBD  What type of clearance is required (medical clearance vs. Pharmacy clearance to hold med vs. Both)? Both  Are there any medications that need to be held prior to surgery and how long? Plavix, held 5 days prior  Practice name and name of physician performing surgery? [facility], Dr. Festus Aloe  What is the office phone number? [phone]   7.   What is the office fax number? [phone]  8.   Anesthesia type (None, local, MAC, general) ? general   Selena Zobro 09/10/2021, 1:41 PM  _________________________________________________________________   (provider comments below)

## 2021-09-14 ENCOUNTER — Encounter: Payer: Self-pay | Admitting: *Deleted

## 2021-09-14 NOTE — Telephone Encounter (Signed)
I copied clearance letter onto [facility] CHART and sent through [place] to the pt.

## 2021-09-14 NOTE — Patient Instructions (Addendum)
DUE TO COVID-19 ONLY ONE VISITOR IS ALLOWED TO COME WITH YOU AND STAY IN THE WAITING ROOM ONLY DURING PRE OP AND PROCEDURE.   **NO VISITORS ARE ALLOWED IN THE SHORT STAY AREA OR RECOVERY ROOM!!**        Your procedure is scheduled on:  Tuesday, 10-13-21   Report to [facility] Main  Entrance     Report to admitting at 8:00 AM   Call this number if you have problems the morning of surgery [phone]   Do not eat food :After Midnight.   May have liquids until 7:15 AM day of surgery  CLEAR LIQUID DIET  Foods Allowed                                                                     Foods Excluded  Water, Black Coffee (no milk/no creamer) and tea, regular and decaf                              liquids that you cannot  Plain Jell-O in any flavor  (No red)                         see through such as: Fruit ices (not with fruit pulp)                                 milk, soups, orange juice  Iced Popsicles (No red)                                    All solid food                             Apple juices Sports drinks like Gatorade (No red) Lightly seasoned clear broth or consume(fat free) Sugar     Oral Hygiene is also important to reduce your risk of infection.                                    Remember - BRUSH YOUR TEETH THE MORNING OF SURGERY WITH YOUR REGULAR TOOTHPASTE   Do NOT smoke after Midnight  Take these medicines the morning of surgery with A SIP OF WATER:  Lovastatin, Metoprolol, Tamsulosin.  Okay to use inhalers                    Plavix - to stop x5 days   Stop all vitamins and herbal supplements a week before surgery             You may not have any metal on your body including  jewelry, and body piercing             Do not wear lotions, powders, cologne, or deodorant              Men may shave face and neck.  Do not bring valuables to the hospital. [place]  NOT RESPONSIBLE FOR VALUABLES.   Contacts, dentures or bridgework may not be worn  into surgery.    Patients discharged the day of surgery will not be allowed to drive home.  Special Instructions: Bring a copy of your healthcare power of attorney and living will documents the day of surgery if you haven't scanned them in before.  Please read over the following fact sheets you were given: IF YOU HAVE QUESTIONS ABOUT YOUR PRE OP INSTRUCTIONS PLEASE CALL [place] - Preparing for Surgery Before surgery, you can play an important role.  Because skin is not sterile, your skin needs to be as free of germs as possible.  You can reduce the number of germs on your skin by washing with CHG (chlorahexidine gluconate) soap before surgery.  CHG is an antiseptic cleaner which kills germs and bonds with the skin to continue killing germs even after washing. Please DO NOT use if you have an allergy to CHG or antibacterial soaps.  If your skin becomes reddened/irritated stop using the CHG and inform your nurse when you arrive at Short Stay. Do not shave (including legs and underarms) for at least 48 hours prior to the first CHG shower.  You may shave your face/neck.  Please follow these instructions carefully:  1.  Shower with CHG Soap the night before surgery and the  morning of surgery.  2.  If you choose to wash your hair, wash your hair first as usual with your normal  shampoo.  3.  After you shampoo, rinse your hair and body thoroughly to remove the shampoo.                             4.  Use CHG as you would any other liquid soap.  You can apply chg directly to the skin and wash.  Gently with a scrungie or clean washcloth.  5.  Apply the CHG Soap to your body ONLY FROM THE NECK DOWN.   Do   not use on face/ open                           Wound or open sores. Avoid contact with eyes, ears mouth and   genitals (private parts).                       Wash face,  Genitals (private parts) with your normal soap.             6.  Wash thoroughly, paying special attention to  the area where your    surgery  will be performed.  7.  Thoroughly rinse your body with warm water from the neck down.  8.  DO NOT shower/wash with your normal soap after using and rinsing off the CHG Soap.                9.  Pat yourself dry with a clean towel.            10.  Wear clean pajamas.            11.  Place clean sheets on your bed the night of your first shower and do not  sleep with pets. Day of Surgery : Do not apply any lotions/deodorants the morning of surgery.  Please wear clean clothes to the hospital/surgery center.  FAILURE TO FOLLOW THESE INSTRUCTIONS MAY RESULT  IN THE CANCELLATION OF YOUR SURGERY  PATIENT SIGNATURE_________________________________  NURSE SIGNATURE__________________________________  ________________________________________________________________________

## 2021-09-14 NOTE — Progress Notes (Signed)
Name: Aaron King  DOB: [birth year]  MRN: [id number]    Primary Cardiologist: Candee Furbish, MD   Chart reviewed as part of pre-operative protocol coverage. Patient was contacted 09/14/2021 in reference to pre-operative risk assessment for pending surgery as outlined below.  Aaron King was last seen on 01/22/2021 by Dr. Marlou Porch.  Since that day, Aaron King has done well without exertional chest pain or worsening dyspnea.   Therefore, based on ACC/AHA guidelines, the patient would be at acceptable risk for the planned procedure without further cardiovascular testing.    Patient may hold Plavix for 5 days prior to the procedure and restart it as soon as possible afterward at the surgeon's discretion.  If he can substitute with aspirin 81 mg on the days he hold plavix, that would be ideal.  However if the bleeding risk is too high, no need for aspirin.   The patient was advised that if he develops new symptoms prior to surgery to contact our office to arrange for a follow-up visit, and he verbalized understanding.   I will route this recommendation to the requesting party via Epic fax function and remove from pre-op pool. Please call with questions.   [place], [place] 09/14/2021, 5:00 PM

## 2021-09-14 NOTE — Telephone Encounter (Signed)
   Name: Aaron King  DOB: 03/16/1946  MRN: [id number]   Primary Cardiologist: Candee Furbish, MD  Chart reviewed as part of pre-operative protocol coverage. Patient was contacted 09/14/2021 in reference to pre-operative risk assessment for pending surgery as outlined below.  Aaron King was last seen on 01/22/2021 by Dr. Marlou Porch.  Since that day, Aaron King has done well without exertional chest pain or worsening dyspnea.  Therefore, based on ACC/AHA guidelines, the patient would be at acceptable risk for the planned procedure without further cardiovascular testing.   Patient may hold Plavix for 5 days prior to the procedure and restart it as soon as possible afterward at the surgeon's discretion.  If he can substitute with aspirin 81 mg on the days he hold plavix, that would be ideal.  However if the bleeding risk is too high, no need for aspirin.  The patient was advised that if he develops new symptoms prior to surgery to contact our office to arrange for a follow-up visit, and he verbalized understanding.  I will route this recommendation to the requesting party via Epic fax function and remove from pre-op pool. Please call with questions.  [place], [place] 09/14/2021, 5:00 PM

## 2021-09-14 NOTE — Telephone Encounter (Signed)
Patient requested a copy of cardiac clearance letter to be mailed to him.

## 2021-09-14 NOTE — Progress Notes (Addendum)
COVID swab appointment:  N/A  COVID Vaccine Completed:  Yes x2 Date COVID Vaccine completed: Has received booster:  Yes x1 COVID vaccine manufacturer: [place]   Date of COVID positive in last 90 days:  No  PCP - Daiva Eves, MD Cardiologist - Candee Furbish, MD  Chest x-ray - N/A EKG - 01-22-21 Epic Stress Test - 2019 Epic ECHO - N/A Cardiac Cath - greater than 2 years Pacemaker/ICD device last checked: Spinal Cord Stimulator:  Sleep Study - N/A CPAP -   Fasting Blood Sugar - N/A Checks Blood Sugar _____ times a day  Blood Thinner Instructions: Plavix.  To hold x5 days.  Patient is aware Aspirin Instructions: Last Dose:  Activity level:   Can go up a flight of stairs and perform activities of daily living without stopping and without symptoms of chest pain or shortness of breath. Patient lives alone.    Anesthesia review: CAD, hx of MI with stent placement, COPD, HTN.  Stop Bang 5  Patient denies shortness of breath, fever, cough and chest pain at PAT appointment   Patient verbalized understanding of instructions that were given to them at the PAT appointment. Patient was also instructed that they will need to review over the PAT instructions again at home before surgery.

## 2021-09-17 ENCOUNTER — Encounter (HOSPITAL_COMMUNITY): Payer: Self-pay

## 2021-09-17 ENCOUNTER — Other Ambulatory Visit: Payer: Self-pay

## 2021-09-17 ENCOUNTER — Encounter (HOSPITAL_COMMUNITY)
Admission: RE | Admit: 2021-09-17 | Discharge: 2021-09-17 | Disposition: A | Discharge status: Home / Self Care | Payer: Medicare Other | Source: Ambulatory Visit | Attending: Urology | Admitting: Urology

## 2021-09-17 VITALS — BP 132/70 | HR 66 | Temp 98.0°F | Resp 16 | Ht 68.0 in | Wt 243.0 lb

## 2021-09-17 DIAGNOSIS — Z01812 Encounter for preprocedural laboratory examination: Secondary | ICD-10-CM | POA: Insufficient documentation

## 2021-09-17 DIAGNOSIS — I10 Essential (primary) hypertension: Secondary | ICD-10-CM | POA: Diagnosis not present

## 2021-09-17 DIAGNOSIS — Z01818 Encounter for other preprocedural examination: Secondary | ICD-10-CM

## 2021-09-17 HISTORY — DX: Malignant neoplasm of prostate: C61

## 2021-09-17 HISTORY — DX: Personal history of urinary calculi: Z87.442

## 2021-09-17 HISTORY — DX: Acute myocardial infarction, unspecified: I21.9

## 2021-09-17 LAB — CBC
HCT: 43.2 % (ref 39.0–52.0)
Hemoglobin: 14.7 g/dL (ref 13.0–17.0)
MCH: 34.8 pg — ABNORMAL HIGH (ref 26.0–34.0)
MCHC: 34 g/dL (ref 30.0–36.0)
MCV: 102.4 fL — ABNORMAL HIGH (ref 80.0–100.0)
Platelets: 278 10*3/uL (ref 150–400)
RBC: 4.22 MIL/uL (ref 4.22–5.81)
RDW: 13.1 % (ref 11.5–15.5)
WBC: 7.8 10*3/uL (ref 4.0–10.5)
nRBC: 0 % (ref 0.0–0.2)

## 2021-09-17 LAB — BASIC METABOLIC PANEL
Anion gap: 10 (ref 5–15)
BUN: 11 mg/dL (ref 8–23)
CO2: 28 mmol/L (ref 22–32)
Calcium: 9.1 mg/dL (ref 8.9–10.3)
Chloride: 103 mmol/L (ref 98–111)
Creatinine, Ser: 0.93 mg/dL (ref 0.61–1.24)
GFR, Estimated: >60 mL/min (ref >60)
Glucose, Bld: 105 mg/dL — ABNORMAL HIGH (ref 70–99)
Potassium: 4.2 mmol/L (ref 3.5–5.1)
Sodium: 141 mmol/L (ref 135–145)

## 2021-09-17 NOTE — Progress Notes (Signed)
   09/17/21 1050  OBSTRUCTIVE SLEEP APNEA  Have you ever been diagnosed with sleep apnea through a sleep study? No  Do you snore loudly (loud enough to be heard through closed doors)?  0  Do you often feel tired, fatigued, or sleepy during the daytime (such as falling asleep during driving or talking to someone)? 0  Has anyone observed you stop breathing during your sleep? 0  Do you have, or are you being treated for high blood pressure? 1  BMI more than 35 kg/m2? 1  Age > 50 (1-yes) 1  Neck circumference greater than:Male 16 inches or larger, Male 17inches or larger? 1  Male Gender (Yes=1) 1  Obstructive Sleep Apnea Score 5  Score 5 or greater  Results sent to PCP

## 2021-09-28 DIAGNOSIS — Z23 Encounter for immunization: Secondary | ICD-10-CM | POA: Diagnosis not present

## 2021-09-30 NOTE — Anesthesia Preprocedure Evaluation (Addendum)
Anesthesia Evaluation  Patient identified by MRN, date of birth, ID band Patient awake    Reviewed: Allergy & Precautions, NPO status , Patient's Chart, lab work & pertinent test results  History of Anesthesia Complications Negative for: history of anesthetic complications  Airway Mallampati: III  TM Distance: >3 FB Neck ROM: Full    Dental  (+) Teeth Intact, Dental Advisory Given   Pulmonary COPD, former smoker,    Pulmonary exam normal        Cardiovascular hypertension, + CAD, + Past MI and + Cardiac Stents  Normal cardiovascular exam     Neuro/Psych negative neurological ROS     GI/Hepatic negative GI ROS, Neg liver ROS,   Endo/Other  negative endocrine ROS  Renal/GU negative Renal ROS   URETHRAL STRICTURE    Musculoskeletal negative musculoskeletal ROS (+)   Abdominal   Peds  Hematology negative hematology ROS (+)   Anesthesia Other Findings   Reproductive/Obstetrics                          Anesthesia Physical Anesthesia Plan  ASA: 3  Anesthesia Plan: General   Post-op Pain Management: Toradol IV (intra-op) and Tylenol PO (pre-op)   Induction: Intravenous  PONV Risk Score and Plan: 2 and Ondansetron, Dexamethasone, Midazolam and Treatment may vary due to age or medical condition  Airway Management Planned: LMA  Additional Equipment: None  Intra-op Plan:   Post-operative Plan: Extubation in OR  Informed Consent: I have reviewed the patients History and Physical, chart, labs and discussed the procedure including the risks, benefits and alternatives for the proposed anesthesia with the patient or authorized representative who has indicated his/her understanding and acceptance.     Dental advisory given  Plan Discussed with:   Anesthesia Plan Comments: (See PAT note 09/17/2021, [name], PA-C)       Anesthesia Quick Evaluation

## 2021-09-30 NOTE — Progress Notes (Signed)
Anesthesia Chart Review   Case: [id number] Date/Time: 10/13/21 1000   Procedure: CYSTOSCOPY WITH OPTILUME URETHRAL DILATATION - ONLY NEEDS 60 MIN   Anesthesia type: General   Pre-op diagnosis: URETHRAL STRICTURE   Location: [place] / [facility]   Surgeons: Festus Aloe, MD       DISCUSSION:75 y.o. former smoker with h/o HTN, CAD (DES), COPD, prostate cancer, urethral stricture scheduled for above procedure 10/13/2021 with Dr. Festus Aloe.   Per cardiology preoperative evaluation 09/14/2021, "Chart reviewed as part of pre-operative protocol coverage. Patient was contacted 09/14/2021 in reference to pre-operative risk assessment for pending surgery as outlined below.  Aaron King was last seen on 01/22/2021 by Dr. Marlou Porch.  Since that day, Aaron King has done well without exertional chest pain or worsening dyspnea.   Therefore, based on ACC/AHA guidelines, the patient would be at acceptable risk for the planned procedure without further cardiovascular testing.    Patient may hold Plavix for 5 days prior to the procedure and restart it as soon as possible afterward at the surgeon's discretion.  If he can substitute with aspirin 81 mg on the days he hold plavix, that would be ideal.  However if the bleeding risk is too high, no need for aspirin."  Anticipate pt can proceed with planned procedure barring acute status change.   VS: BP 132/70   Pulse 66   Temp 36.7 C (Oral)   Resp 16   Ht 5\' 8"  (1.727 m)   Wt 110.2 kg   SpO2 100%   BMI 36.95 kg/m   PROVIDERS: [place], Lorin Mercy, MD is PCP   Candee Furbish, MD is Cardiologist  LABS: Labs reviewed: Acceptable for surgery. (all labs ordered are listed, but only abnormal results are displayed)  Labs Reviewed  BASIC METABOLIC PANEL - Abnormal; Notable for the following components:      Result Value   Glucose, Bld 105 (*)    All other components within normal limits  CBC - Abnormal; Notable for the following components:    MCV 102.4 (*)    MCH 34.8 (*)    All other components within normal limits     IMAGES:   EKG: 01/22/2021 Rate 60 bpm   CV: Stress Test 08/11/2018 The left ventricular ejection fraction is hyperdynamic (>65%). Nuclear stress EF: 67%. There was no ST segment deviation noted during stress. The study is normal. This is a low risk study. Past Medical History:  Diagnosis Date   Anticoagulant long-term use    plavix   CAD (coronary artery disease) cardiologist--- dr dalton mclean   a. 05/2010 NSTEMI/Cath/PCI: LM 72, LAD min irregs, LCX min irregs, OM1 99(2.5x12 Promus DES), RCA 30p, EF 65%;  b. effient d/c 2/2 to gum bleeding and epistaxis   COPD with emphysema ([place])    DOE (dyspnea on exertion)    Dyslipidemia    ETOH abuse    Gross hematuria    History of kidney stones    History of non-ST elevation myocardial infarction (NSTEMI) 05/2010   s/p  PCI and DES x1   History of prostate cancer    s/p XRT and seed implantation in May 2010   Hypertension    Myocardial infarction [facility])    Nocturia    Primary localized osteoarthritis of right hip 05/13/2020   Prostate cancer ([place])    S/P drug eluting coronary stent placement 05/2010   Urethral stricture 05/13/2020   Urology says use 14 french catheter   Dr Junious Silk  said to place in preop.  Call Urology if any issues in preop placing this catheter.  [phone]   Urethral stricture in male     Past Surgical History:  Procedure Laterality Date   APPENDECTOMY  1960   COLONOSCOPY WITH PROPOFOL  last one 2017   CORONARY ANGIOPLASTY WITH STENT PLACEMENT  05-15-2010  DR CRENSHAW   PCI W/ DRUG-ELUTING STENT X1 IN THE FIRST OBTUSE MARGINAL BRANCH OF CIRCUMFLEX ARTERY/ NON-STEMI/  EF 65%   CYSTOSCOPY WITH URETHRAL DILATATION N/A 03/06/2019   Procedure: CYSTOSCOPY WITH URETHRAL STRICTURE DILATATION, FOLEY CATHETER PLACEMENT;  Surgeon: Festus Aloe, MD;  Location: [facility];  Service: Urology;  Laterality: N/A;   MASS EXCISION   08/23/2012   Procedure: EXCISION MASS;  Surgeon: Leighton Ruff, MD;  Location: [facility];  Service: General;  Laterality: Left;  excisional biopsy left thigh mass   PROSTATE BIOPSY     RADIOACTIVE PROSTATE SEED IMPLANTS  03/31/2009   PROSTATE CANCER   REPAIR RIGHT INGUINAL HERNIA  1980   TOTAL HIP ARTHROPLASTY Right 06/03/2020   Procedure: TOTAL HIP ARTHROPLASTY ANTERIOR APPROACH;  Surgeon: Renette Butters, MD;  Location: [facility];  Service: Orthopedics;  Laterality: Right;    MEDICATIONS:  albuterol (PROVENTIL HFA;VENTOLIN HFA) 108 (90 Base) MCG/ACT inhaler   clopidogrel (PLAVIX) 75 MG tablet   furosemide (LASIX) 20 MG tablet   metoprolol succinate (TOPROL-XL) 25 MG 24 hr tablet   nitroGLYCERIN (NITROSTAT) 0.4 MG SL tablet   rosuvastatin (CRESTOR) 20 MG tablet   Tamsulosin HCl (FLOMAX) 0.4 MG CAPS   triamcinolone cream (KENALOG) 0.1 %   No current facility-administered medications for this encounter.    [name], PA-C [facility] Pre-Surgical Testing [phone]

## 2021-10-06 DIAGNOSIS — R8271 Bacteriuria: Secondary | ICD-10-CM | POA: Diagnosis not present

## 2021-10-06 DIAGNOSIS — N35011 Post-traumatic bulbous urethral stricture: Secondary | ICD-10-CM | POA: Diagnosis not present

## 2021-10-06 DIAGNOSIS — H25813 Combined forms of age-related cataract, bilateral: Secondary | ICD-10-CM | POA: Diagnosis not present

## 2021-10-13 ENCOUNTER — Ambulatory Visit (HOSPITAL_COMMUNITY): Payer: Medicare Other

## 2021-10-13 ENCOUNTER — Ambulatory Visit (HOSPITAL_COMMUNITY): Payer: Medicare Other | Admitting: Anesthesiology

## 2021-10-13 ENCOUNTER — Encounter (HOSPITAL_COMMUNITY)
Admission: RE | Disposition: A | Discharge status: Home / Self Care | Payer: Self-pay | Source: Ambulatory Visit | Attending: Urology

## 2021-10-13 ENCOUNTER — Encounter (HOSPITAL_COMMUNITY): Payer: Self-pay | Admitting: Urology

## 2021-10-13 ENCOUNTER — Ambulatory Visit (HOSPITAL_COMMUNITY)
Admission: RE | Admit: 2021-10-13 | Discharge: 2021-10-13 | Disposition: A | Discharge status: Home / Self Care | Payer: Medicare Other | Source: Ambulatory Visit | Attending: Urology | Admitting: Urology

## 2021-10-13 ENCOUNTER — Ambulatory Visit (HOSPITAL_COMMUNITY): Payer: Medicare Other | Admitting: Physician Assistant

## 2021-10-13 DIAGNOSIS — I25110 Atherosclerotic heart disease of native coronary artery with unstable angina pectoris: Secondary | ICD-10-CM | POA: Diagnosis not present

## 2021-10-13 DIAGNOSIS — R310 Gross hematuria: Secondary | ICD-10-CM | POA: Diagnosis not present

## 2021-10-13 DIAGNOSIS — N35912 Unspecified bulbous urethral stricture, male: Secondary | ICD-10-CM | POA: Diagnosis not present

## 2021-10-13 DIAGNOSIS — I10 Essential (primary) hypertension: Secondary | ICD-10-CM | POA: Diagnosis not present

## 2021-10-13 DIAGNOSIS — Z87891 Personal history of nicotine dependence: Secondary | ICD-10-CM | POA: Insufficient documentation

## 2021-10-13 DIAGNOSIS — E782 Mixed hyperlipidemia: Secondary | ICD-10-CM | POA: Diagnosis not present

## 2021-10-13 DIAGNOSIS — R3912 Poor urinary stream: Secondary | ICD-10-CM | POA: Diagnosis not present

## 2021-10-13 DIAGNOSIS — N99111 Postprocedural bulbous urethral stricture: Secondary | ICD-10-CM | POA: Diagnosis not present

## 2021-10-13 HISTORY — PX: CYSTOSCOPY WITH URETHRAL DILATATION: SHX5125

## 2021-10-13 SURGERY — CYSTOSCOPY, WITH URETHRAL DILATION
Anesthesia: General

## 2021-10-13 MED ORDER — PROPOFOL 10 MG/ML IV BOLUS
INTRAVENOUS | Status: DC | PRN
  Administered 2021-10-13: 130 mg via INTRAVENOUS

## 2021-10-13 MED ORDER — KETOROLAC TROMETHAMINE 30 MG/ML IJ SOLN
INTRAMUSCULAR | Status: AC
  Filled 2021-10-13: qty 1

## 2021-10-13 MED ORDER — DEXAMETHASONE SODIUM PHOSPHATE 10 MG/ML IJ SOLN
INTRAMUSCULAR | Status: DC | PRN
  Administered 2021-10-13: 10 mg via INTRAVENOUS

## 2021-10-13 MED ORDER — CHLORHEXIDINE GLUCONATE 0.12 % MT SOLN
15.0000 mL | Freq: Once | OROMUCOSAL | Status: AC
  Administered 2021-10-13: 15 mL via OROMUCOSAL

## 2021-10-13 MED ORDER — ONDANSETRON HCL 4 MG/2ML IJ SOLN
INTRAMUSCULAR | Status: DC | PRN
  Administered 2021-10-13: 4 mg via INTRAVENOUS

## 2021-10-13 MED ORDER — FENTANYL CITRATE PF 50 MCG/ML IJ SOSY: 25.0000 ug | PREFILLED_SYRINGE | INTRAMUSCULAR | Status: DC | PRN

## 2021-10-13 MED ORDER — PROPOFOL 10 MG/ML IV BOLUS
INTRAVENOUS | Status: AC
  Filled 2021-10-13: qty 20

## 2021-10-13 MED ORDER — ONDANSETRON HCL 4 MG/2ML IJ SOLN: 4.0000 mg | Freq: Once | INTRAMUSCULAR | Status: DC | PRN

## 2021-10-13 MED ORDER — FENTANYL CITRATE (PF) 100 MCG/2ML IJ SOLN
INTRAMUSCULAR | Status: DC | PRN
  Administered 2021-10-13 (×2): 25 ug via INTRAVENOUS

## 2021-10-13 MED ORDER — STERILE WATER FOR IRRIGATION IR SOLN
Status: DC | PRN
  Administered 2021-10-13: 3000 mL via INTRAVESICAL

## 2021-10-13 MED ORDER — LIDOCAINE 2% (20 MG/ML) 5 ML SYRINGE
INTRAMUSCULAR | Status: DC | PRN
  Administered 2021-10-13: 100 mg via INTRAVENOUS

## 2021-10-13 MED ORDER — CEFAZOLIN SODIUM-DEXTROSE 2-4 GM/100ML-% IV SOLN
2.0000 g | INTRAVENOUS | Status: AC
  Administered 2021-10-13: 2 g via INTRAVENOUS
  Filled 2021-10-13: qty 100

## 2021-10-13 MED ORDER — DEXAMETHASONE SODIUM PHOSPHATE 10 MG/ML IJ SOLN
INTRAMUSCULAR | Status: AC
  Filled 2021-10-13: qty 1

## 2021-10-13 MED ORDER — LIDOCAINE HCL (PF) 2 % IJ SOLN
INTRAMUSCULAR | Status: AC
  Filled 2021-10-13: qty 5

## 2021-10-13 MED ORDER — EPHEDRINE SULFATE-NACL 50-0.9 MG/10ML-% IV SOSY
PREFILLED_SYRINGE | INTRAVENOUS | Status: DC | PRN
  Administered 2021-10-13: 5 mg via INTRAVENOUS
  Administered 2021-10-13: 10 mg via INTRAVENOUS

## 2021-10-13 MED ORDER — DIATRIZOATE MEGLUMINE 30 % UR SOLN
URETHRAL | Status: AC
  Filled 2021-10-13: qty 100

## 2021-10-13 MED ORDER — ACETAMINOPHEN 500 MG PO TABS
1000.0000 mg | ORAL_TABLET | Freq: Once | ORAL | Status: AC
  Administered 2021-10-13: 1000 mg via ORAL
  Filled 2021-10-13: qty 2

## 2021-10-13 MED ORDER — AMISULPRIDE (ANTIEMETIC) 5 MG/2ML IV SOLN: 10.0000 mg | Freq: Once | INTRAVENOUS | Status: DC | PRN

## 2021-10-13 MED ORDER — FENTANYL CITRATE (PF) 100 MCG/2ML IJ SOLN
INTRAMUSCULAR | Status: AC
  Filled 2021-10-13: qty 2

## 2021-10-13 MED ORDER — ORAL CARE MOUTH RINSE: 15.0000 mL | Freq: Once | OROMUCOSAL | Status: AC

## 2021-10-13 MED ORDER — KETOROLAC TROMETHAMINE 30 MG/ML IJ SOLN
INTRAMUSCULAR | Status: DC | PRN
  Administered 2021-10-13: 30 mg via INTRAVENOUS

## 2021-10-13 MED ORDER — OXYCODONE HCL 5 MG PO TABS: 5.0000 mg | ORAL_TABLET | Freq: Once | ORAL | Status: DC | PRN

## 2021-10-13 MED ORDER — IOHEXOL 300 MG/ML  SOLN
INTRAMUSCULAR | Status: DC | PRN
  Administered 2021-10-13: 10 mL

## 2021-10-13 MED ORDER — EPHEDRINE 5 MG/ML INJ
INTRAVENOUS | Status: AC
  Filled 2021-10-13: qty 5

## 2021-10-13 MED ORDER — OXYCODONE HCL 5 MG/5ML PO SOLN: 5.0000 mg | Freq: Once | ORAL | Status: DC | PRN

## 2021-10-13 MED ORDER — LACTATED RINGERS IV SOLN: INTRAVENOUS | Status: DC

## 2021-10-13 MED ORDER — ONDANSETRON HCL 4 MG/2ML IJ SOLN
INTRAMUSCULAR | Status: AC
  Filled 2021-10-13: qty 2

## 2021-10-13 SURGICAL SUPPLY — 20 items
BAG URINE DRAIN 2000ML AR STRL (UROLOGICAL SUPPLIES) ×2 IMPLANT
BALLN NEPHROSTOMY (BALLOONS)
BALLN OPTILUME DCB 30X5X75 (BALLOONS) ×2
BALLOON NEPHROSTOMY (BALLOONS) IMPLANT
BALLOON OPTILUME DCB 30X5X75 (BALLOONS) ×1 IMPLANT
CATH FOLEY 2W COUNCIL 20FR 5CC (CATHETERS) IMPLANT
CATH FOLEY 2W COUNCIL 5CC 16FR (CATHETERS) ×2 IMPLANT
CATH URETL OPEN END 6FR 70 (CATHETERS) IMPLANT
CLOTH BEACON ORANGE TIMEOUT ST (SAFETY) ×2 IMPLANT
DEVICE INFLATION ATRION QL4015 (MISCELLANEOUS) ×2 IMPLANT
GLOVE SURG ENC MOIS LTX SZ7.5 (GLOVE) ×2 IMPLANT
GOWN STRL REUS W/TWL XL LVL3 (GOWN DISPOSABLE) ×2 IMPLANT
GUIDEWIRE ANG ZIPWIRE 038X150 (WIRE) IMPLANT
GUIDEWIRE STR DUAL SENSOR (WIRE) ×2 IMPLANT
MANIFOLD NEPTUNE II (INSTRUMENTS) IMPLANT
NS IRRIG 1000ML POUR BTL (IV SOLUTION) IMPLANT
PACK CYSTO (CUSTOM PROCEDURE TRAY) ×2 IMPLANT
PENCIL SMOKE EVACUATOR (MISCELLANEOUS) IMPLANT
SET AMPLATZ RENAL DILATOR (MISCELLANEOUS) ×2 IMPLANT
WATER STERILE IRR 3000ML UROMA (IV SOLUTION) ×2 IMPLANT

## 2021-10-13 NOTE — H&P (Signed)
Office Visit Report     10/06/2021   --------------------------------------------------------------------------------   Aaron King  MRN: [id number]  DOB: 1946/06/16, 75 year old Male  SSN: [id number]   PRIMARY CARE:  Aaron Holler, NP  REFERRING:  Aaron Dover, MD  PROVIDER:  Festus King, M.D.  TREATING:  Aaron Crocker, NP  LOCATION:  [facility] [phone]     --------------------------------------------------------------------------------   CC/HPI: F/u -   1) urethral sx - Cystoscopy revealed a membranous urethral stricture which was balloon dilated in OR 02/2019.   Cysto June 2021 done for increasing LUTS - Mild membranous stricture - dilated with 14Fr straight catheter and then the scope. Initially scope would not pass and a 14 Fr wouldn't pass either but opened just enough for me to see and work the flex scope through.   Repeat cysto Jul 2021 with 12 Fr sx. I passed a 14 Fr CIC cath, the flex cystoscope and then a foley prior to right his surgery.   He was seen 02/22 with wk stream. PVE 39 ml. Urine cx + for klebsiella and he was tx with Augmentin. He has developed more of a bothersome weak stream. Only a "drip, drip, drip" especially at night. He asked about "alternative procedure" for his weak stream. PVR 000. He wants me to take a look. Will need Ceftriaxone.   Cysto 03/22 required wire and dilation. Cysto 06/22, I was able to pass a 14Fr cath to pop through and then cystoscope passed. He is well today - 10/22. 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 PSA is low -- 0.015 Oct 2018. PSA udt Dec 2021.    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 Quit smoking 14 May 2010. AUASS was 18. CT was done January 12, 2018 and showed a 2 mm left UVJ stone. There was no hydronephrosis. The patient reports he typically voids with a weak stream. Cystoscopy benign - July 2021.  No further gross hematuria. No dysuria. Some bacteria in urine today.    He returns in management of the above.   He is on clopidogrel with Dr. Marlou King.   10/06/2021: Here today for preop visit prior to undergoing urethral dilation using the new Optilume procedure for recurrent bulbar urethral stricture on 12/6. Overall doing well, looking forward to his upcoming procedure. He continues to void with weak and intermittent stream, often described as dribbling taking several minutes to achieve sensation of complete emptying. Despite this his baseline frequency/urgency and nocturia remained stable. He has not had any interval dysuria or gross hematuria. Denies changes to past medical history, prescription medications taken on a daily basis, no interval procedural or surgical intervention. Denies any interval fevers or chills, nausea/vomiting.     ALLERGIES: Hydrocodone oxycodone    MEDICATIONS: Tamsulosin Hcl 0.4 mg capsule 1 capsule PO Daily  Celebrex PRN  Furosemide PRN  Lisinopril 30 mg tablet 0 Oral  Metoprolol Succinate 25 mg tablet, extended release 24 hr 1 Oral Daily  Multi-Day Vitamins tablet Oral  Nitrostat 0.4 mg tablet, sublingual Sublingual  Plavix 75 mg tablet 0 Oral  Proair Hfa 90 mcg hfa aerosol with adapter Inhalation PRN  Rosuvastatin Calcium 20 mg tablet  Vitamin B12     GU PSH: Cysto Dilate Stricture (M or F) - 01/23/2021, 2020, 2020 Cystoscopy - 08/19/2021, 04/22/2021, 06/02/2020, 04/09/2020, 2019 Locm 300-399Mg /Ml Iodine,1Ml -  2019, 2018 [place], PROS - 2010       [place] Notes: Dermatological Surgery, Heart Surgery, Surgery Prostate Transperineal Placement Of Needles, Inguinal Hernia Repair, Hemorrhoidectomy, Hand Surgery, Appendectomy   NON-GU PSH: Appendectomy - 2010 Colonoscopy - 2017 Hemorrhoidectomy (favorite) - 2010 Hip Arthroscopy/surgery, Right     GU PMH: Bulbar urethral stricture, He is got a tight recurrent bulbar stricture. We went over  the nature risk benefits and alternatives to OptiLube balloon dilation and he elects to proceed. We discussed stricture recurrence and incontinence among other risks. - 08/19/2021 Membranous urethral stricture, check cysto in 4 mo and if successful - 6 months . Discussed nature r/b of office vs OR dilation (balloon). - 04/22/2021, Plan cysto in 3 months so I can hopefully dilate with a 14Fr in and out and not use the wire. We talked about ways to fix this sx, risks (incontinence, recurrence), why XRT sxs are difficult to manage and why he doesn't need a prostate procedure (He asked about PUL). , - 01/23/2021, - 12/29/2020, monitor LUTS. , - 10/15/2020, - 2019 Weak Urinary Stream (Stable) - 12/29/2020, - 2019 History of prostate cancer, PSA was sent - 10/15/2020, (Stable), - 2019, History of prostate cancer, - 2016 BPH w/LUTS - 2021 Gross hematuria - 2020, - 2019, - 2019 Ureteral calculus (Improving) - 2019 Nocturia - 2018 Microscopic hematuria - 2018 Prostate Cancer - 2018, Prostate cancer, - 2015 Dysuria, Dysuria - 2014 ED due to arterial insufficiency, Erectile dysfunction due to arterial insufficiency - 2014 Elevated PSA, Elevated prostate specific antigen (PSA) - 2014      PMH Notes:  Past urologic history.  Aaron King is status post seed implantation in May of 2010. 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 Pretreatment PSA was in the 3-4 range. He had low-volume Gleason 6 cancer. His dosimetry was excellent with a D90 of 111% and minimal rectal dosing. His prostate volume was approximately 25 grams. ED has worsened but this is not being currently treated secondary to recent cardiac event. PSA data has looked very good so far with PSA of 0.10 in June of 2012 and down to 0.05 in December of 2012. He does take Flomax daily.     NON-GU PMH: Bacteriuria - 12/29/2020, (Stable), aymptomatic but will send cx and consider abx given the recent hip replacement  , - 10/15/2020, as above - pt urine very concentrated. No dysuria. Neg N and LE. He is not clinically infected. I sent urine cx in the event the becomes symptomatic and needs further abx. , - 06/02/2020 Encounter for general adult medical examination without abnormal findings, Encounter for preventive health examination - 2016 Personal history of other diseases of the circulatory system, History of hypertension - 2014 Myocardial Infarction    FAMILY HISTORY: Acute Myocardial Infarction - Mother Family Health Status Number - No Family History Father Deceased At Age40 ___ - Runs In Family Hypertension - Mother Mother Deceased At Age 82 from diabetic complicati - Runs In Family Prostate Cancer - Father, Brother   SOCIAL HISTORY: Marital Status: Single Preferred Language: English; Race: White Current Smoking Status: Patient does not smoke anymore. Has not smoked since 06/08/2010.  Drinks 2 drinks per day. Types of alcohol consumed: Beer. Light Drinker.  Drinks 2 caffeinated drinks per day. 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     Notes: Former smoker, Caffeine Use, Marital History - Divorced, Alcohol Use, Tobacco Use, Occupation:   REVIEW OF  SYSTEMS:    GU Review Male:   Patient reports frequent urination, stream starts and stops, trouble starting your stream, and have to strain to urinate . Patient denies hard to postpone urination, burning/ pain with urination, get up at night to urinate, leakage of urine, erection problems, and penile pain.  Gastrointestinal (Upper):   Patient denies nausea, vomiting, and indigestion/ heartburn.  Gastrointestinal (Lower):   Patient denies diarrhea and constipation.  Constitutional:   Patient denies fever, night sweats, weight loss, and fatigue.  Skin:   Patient denies skin rash/ lesion and itching.  Eyes:   Patient denies blurred vision and double vision.  Ears/ Nose/ Throat:   Patient denies sore throat and sinus problems.   Hematologic/Lymphatic:   Patient reports easy bruising. Patient denies swollen glands.  Cardiovascular:   Patient denies leg swelling and chest pains.  Respiratory:   Patient denies cough and shortness of breath.  Endocrine:   Patient denies excessive thirst.  Musculoskeletal:   Patient denies back pain and joint pain.  Neurological:   Patient denies headaches and dizziness.  Psychologic:   Patient denies depression and anxiety.   VITAL SIGNS:      10/06/2021 01:59 PM  Weight 232 lb / 105.23 kg  Height 68 in / 172.72 cm  BP 116/76 mmHg  Pulse 81 /min  Temperature 98.2 F / 36.7 C  BMI 35.3 kg/m   MULTI-SYSTEM PHYSICAL EXAMINATION:    Constitutional: Well-nourished. No physical deformities. Normally developed. Good grooming.  Neck: Neck symmetrical, not swollen. Normal tracheal position.  Respiratory: No labored breathing, no use of accessory muscles.   Cardiovascular: Normal temperature, normal extremity pulses, no swelling, no varicosities.  Skin: No paleness, no jaundice, no cyanosis. No lesion, no ulcer, no rash.  Neurologic / Psychiatric: Oriented to time, oriented to place, oriented to person. No depression, no anxiety, no agitation.  Gastrointestinal: Obese abdomen. No mass, no tenderness, no rigidity.   Musculoskeletal: Normal gait and station of head and neck.     Complexity of Data:  Source Of History:  Patient, Medical Record Summary  Lab Test Review:   PSA  Records Review:   Pathology Reports, Previous Doctor Records, Previous Hospital Records, Previous Patient Records  Urine Test Review:   Urinalysis, Urine Culture   10/15/20 09/28/19 09/13/18 08/08/17 06/14/16 06/06/15 05/03/14 11/07/13  PSA  Total PSA <0.015 ng/mL <0.015 ng/mL <0.015 ng/mL <0.015 ng/mL 0.1  0.02  <0.01  0.01     10/06/21  Urinalysis  Urine Appearance Slightly Cloudy   Urine Color Amber   Urine Glucose Neg mg/dL  Urine Bilirubin Neg mg/dL  Urine Ketones Neg mg/dL  Urine Specific Gravity  1.030   Urine Blood Neg ery/uL  Urine pH 6.0   Urine Protein 1+ mg/dL  Urine Urobilinogen 1.0 mg/dL  Urine Nitrites Neg   Urine Leukocyte Esterase Neg leu/uL  Urine WBC/hpf NS (Not Seen)   Urine RBC/hpf 0 - 2/hpf   Urine Epithelial Cells 0 - 5/hpf   Urine Bacteria NS (Not Seen)   Urine Mucous Present   Urine Yeast NS (Not Seen)   Urine Trichomonas Not Present   Urine Cystals NS (Not Seen)   Urine Casts NS (Not Seen)   Urine Sperm Present    PROCEDURES:          Urinalysis w/Scope Dipstick Dipstick Cont'd Micro  Color: Amber Bilirubin: Neg mg/dL WBC/hpf: NS (Not Seen)  Appearance: Slightly Cloudy Ketones: Neg mg/dL RBC/hpf: 0 - 2/hpf  Specific Gravity: 1.030 Blood:  Neg ery/uL Bacteria: NS (Not Seen)  pH: 6.0 Protein: 1+ mg/dL Cystals: NS (Not Seen)  Glucose: Neg mg/dL Urobilinogen: 1.0 mg/dL Casts: NS (Not Seen)    Nitrites: Neg Trichomonas: Not Present    Leukocyte Esterase: Neg leu/uL Mucous: Present      Epithelial Cells: 0 - 5/hpf      Yeast: NS (Not Seen)      Sperm: Present    Notes: MICROSCOPIC NOT CONCENTRATED    ASSESSMENT:      ICD-10 Details  1 GU:   Bulbar urethral stricture - N35.011 Chronic, Threat to Bodily Function   PLAN:           Orders Labs Urine Culture          Schedule Return Visit/Planned Activity: Keep Scheduled Appointment - Follow up MD, Schedule Surgery          Document Letter(s):  Created for Patient: Clinical Summary         Notes:   All questions answered to the best of my ability regarding the upcoming procedure and expected postoperative course with understanding expressed by the patient. A urine culture will be sent today to serve as baseline. Moving forward he will proceed with urethral dilation using the optilume procedure on 12/06 with his urologist.        Next Appointment:      Next Appointment: 10/13/2021 10:15 AM    Appointment Type: Surgery     Location: [facility] [phone]    Provider:  Festus King, M.D.    Reason for Visit: WL/OP OPTILUME BALLOON DILATION      * Signed by Aaron Crocker, NP on 10/06/21 at 2:35 PM (EST)*      The information contained in this medical record document is considered private and confidential patient information. This information can only be used for the medical diagnosis and/or medical services that are being provided by the patient's selected caregivers. This information can only be distributed outside of the patient's care if the patient agrees and signs waivers of authorization for this information to be sent to an outside source or route.   Urine cx + strep - on cephalexin.

## 2021-10-13 NOTE — Anesthesia Procedure Notes (Signed)
Procedure Name: LMA Insertion Date/Time: 10/13/2021 11:11 AM Performed by: Sharlette Dense, CRNA Patient Re-evaluated:Patient Re-evaluated prior to induction Oxygen Delivery Method: Circle system utilized Preoxygenation: Pre-oxygenation with 100% oxygen Induction Type: IV induction LMA: LMA inserted LMA Size: 4.0 Number of attempts: 1 Placement Confirmation: positive ETCO2 and breath sounds checked- equal and bilateral Tube secured with: Tape Dental Injury: Teeth and Oropharynx as per pre-operative assessment

## 2021-10-13 NOTE — Transfer of Care (Signed)
Immediate Anesthesia Transfer of Care Note  Patient: Aaron King  Procedure(s) Performed: CYSTOSCOPY WITH OPTILUME URETHRAL DILATATION  Patient Location: PACU  Anesthesia Type:General  Level of Consciousness: awake, alert  and oriented  Airway & Oxygen Therapy: Patient Spontanous Breathing and Patient connected to face mask oxygen  Post-op Assessment: Report given to RN and Post -op Vital signs reviewed and stable  Post vital signs: Reviewed and stable  Last Vitals:  Vitals Value Taken Time  BP 141/82 10/13/21 1200  Temp    Pulse 64 10/13/21 1201  Resp 18 10/13/21 1201  SpO2 100 % 10/13/21 1201  Vitals shown include unvalidated device data.  Last Pain:  Vitals:   10/13/21 0822  TempSrc: Oral  PainSc: 0-No pain         Complications: No notable events documented.

## 2021-10-13 NOTE — Discharge Instructions (Signed)
Urethral dilation, Care After This sheet gives you information about how to care for yourself after your procedure. Your health care provider may also give you more specific instructions. If you have problems or questions, contact your health care provider. What can I expect after the procedure? After the procedure, it is common to have: Mild pain in your bladder area during urination. Minor burning during urination. Small amounts of blood in your urine. A sudden urge to urinate. A need to urinate more often than usual. Follow these instructions at home: Medicines Take over-the-counter and prescription medicines only as told by your health care provider. If you were prescribed an antibiotic medicine, take it as told by your health care provider. Do not stop taking the antibiotic even if you start to feel better. Activity Rest if told by your health care provider. Do not drive for 24 hours if you received a medicine to help you relax (sedative) during your procedure. Ask your health care provider when it is safe for you to drive. Return to your normal activities as told by your health care provider. Ask your health care provider what activities are safe for you. Use a condom during sex for 30 days after the procedure General instructions Take a warm bath to relieve any burning sensations around your urethra. Hold a warm, damp washcloth over the urethral area to ease pain. It is up to you to get the results of your procedure. Ask your health care provider, or the department that is doing the procedure, when your results will be ready. Keep all follow-up visits as told by your health care provider. This is important. Contact a health care provider if: You have a fever. Your symptoms do not improve within 24 hours, and you continue to have: Burning during urination. Increasing amounts of blood in your urine. Pain during urination. An urgent need to urinate. A need to urinate more often than  usual. Get help right away if: You have a lot of bleeding or more bleeding. You have severe pain. You are unable to urinate. You have bright red blood in your urine. You are passing blood clots in your urine. You have a fever. Summary After the procedure, it is common to have mild pain, burning with urination, and some blood. Take medicines as told. If you were given antibiotics, finish all of it even if you start to feel better. Rest after the procedure. Follow your health care provider's instructions for self care at home. Contact a health care provider if your symptoms do not improve within 24 hours, or if you have more pain or more blood in your urine. Get help right away if you have a lot of bleeding, severe pain, fever, or bright red blood or blood clots in the urine. This information is not intended to replace advice given to you by your health care provider. Make sure you discuss any questions you have with your health care provider. Document Revised: 05/02/2019 Document Reviewed: 05/02/2019 Elsevier Patient Education  [place].

## 2021-10-13 NOTE — Interval H&P Note (Signed)
History and Physical Interval Note:  10/13/2021 10:57 AM  Aaron King  has presented today for surgery, with the diagnosis of URETHRAL STRICTURE.  The various methods of treatment have been discussed with the patient and family. After consideration of risks, benefits and other options for treatment, the patient has consented to  Procedure(s) with comments: CYSTOSCOPY WITH OPTILUME URETHRAL DILATATION (N/A) - ONLY NEEDS 60 MIN as a surgical intervention.  The patient's history has been reviewed, patient examined, no change in status, stable for surgery.  I have reviewed the patient's chart and labs.  Questions were answered to the patient's satisfaction.  He continues cephalexin.  No dysuria or gross hematuria.  Again we went over the recurrent nature of urethral stricture dilation and the nature risk benefits and alternatives to OptiLube dilation with paclitaxel coated balloon.  All questions answered.  He elects to proceed.   Festus Aloe

## 2021-10-13 NOTE — Op Note (Signed)
Preoperative diagnosis: Bulbar urethral stricture Postoperative diagnosis: Same  Procedure: Cystoscopy with OptiLume balloon dilation of urethral stricture, Foley catheter placement  Surgeon: Junious Silk  Anesthesia: General  Indication for procedure: Aaron King is a 75 year old male with a history of stricture disease.  He had prior brachytherapy.  He is voiding with a weak stream and had recurrent stricture on cystoscopy.  We discussed the nature risk benefits and alternatives to urethral stricture dilation such as stricture recurrence and incontinence among others and he elected to proceed.  Findings: The penis, glans meatus all appeared normal.  No lesions.  On cystoscopy the urethra was normal down to the bulbar and there was a tight 5 French stricture.  Stricture was short only about 10 mm.  Description of procedure: After consent was obtained patient brought to the operating room.  After adequate anesthesia he is placed lithotomy position and prepped and draped in the usual sterile fashion.  Timeout was performed to confirm the patient and procedure.  Cystoscope was passed per urethra where the stricture was encountered.  A wire was coiled in the bladder under fluoroscopic guidance.  It was the renal dilator set to dilate from 12-18 [place].  I then repassed the rigid cystoscope which now easily passed into the prostatic urethra and up into the bladder confirming wire placement.  The scope was backed out.  A 30 French 5 cm OptiLube balloon was then passed and under direct visualization positioned along the stricture.  The balloon was let to presoaked for a minute and then the balloon was inflated left seated for 5 minutes.  Placement of balloon was confirmed on fluoroscopic scout imaging as well as direct visualization with the cystoscope.  Balloon was deflated and backed out.  A 16 French Foley catheter was advanced over the wire, wire removed and balloon inflated.  Catheter seated at bladder neck and in  good position under fluoroscopy.  Patient was awakened and taken to recovery room in stable condition.  Complications: None  Blood loss: Minimal  Specimens: None  Drains: 16 French Foley catheter to gravity drainage  Disposition: Patient stable to PACU-discussed with friend Mortimer Fries and went over the procedure, postop care and follow-up.

## 2021-10-13 NOTE — Anesthesia Postprocedure Evaluation (Signed)
Anesthesia Post Note  Patient: Aaron King  Procedure(s) Performed: CYSTOSCOPY WITH OPTILUME URETHRAL DILATATION     Patient location during evaluation: PACU Anesthesia Type: General Level of consciousness: awake and alert Pain management: pain level controlled Vital Signs Assessment: post-procedure vital signs reviewed and stable Respiratory status: spontaneous breathing, nonlabored ventilation and respiratory function stable Cardiovascular status: blood pressure returned to baseline and stable Postop Assessment: no apparent nausea or vomiting Anesthetic complications: no   No notable events documented.  Last Vitals:  Vitals:   10/13/21 1230 10/13/21 1242  BP: 132/74 (!) 142/73  Pulse: 65 60  Resp: 17 18  Temp:  (!) 36.4 C  SpO2: 99% 99%    Last Pain:  Vitals:   10/13/21 1242  TempSrc:   PainSc: 0-No pain                 Lidia Collum

## 2021-10-16 ENCOUNTER — Encounter (HOSPITAL_COMMUNITY): Payer: Self-pay | Admitting: Urology

## 2021-10-16 DIAGNOSIS — N35011 Post-traumatic bulbous urethral stricture: Secondary | ICD-10-CM | POA: Diagnosis not present

## 2021-10-16 DIAGNOSIS — Z8546 Personal history of malignant neoplasm of prostate: Secondary | ICD-10-CM | POA: Diagnosis not present

## 2021-10-19 DIAGNOSIS — H353121 Nonexudative age-related macular degeneration, left eye, early dry stage: Secondary | ICD-10-CM | POA: Diagnosis not present

## 2021-10-19 DIAGNOSIS — H2512 Age-related nuclear cataract, left eye: Secondary | ICD-10-CM | POA: Diagnosis not present

## 2021-11-11 DIAGNOSIS — Z8546 Personal history of malignant neoplasm of prostate: Secondary | ICD-10-CM | POA: Diagnosis not present

## 2021-11-11 DIAGNOSIS — N35011 Post-traumatic bulbous urethral stricture: Secondary | ICD-10-CM | POA: Diagnosis not present

## 2021-11-13 DIAGNOSIS — H2512 Age-related nuclear cataract, left eye: Secondary | ICD-10-CM | POA: Diagnosis not present

## 2021-11-27 DIAGNOSIS — H2511 Age-related nuclear cataract, right eye: Secondary | ICD-10-CM | POA: Diagnosis not present

## 2021-12-04 DIAGNOSIS — H25811 Combined forms of age-related cataract, right eye: Secondary | ICD-10-CM | POA: Diagnosis not present

## 2021-12-24 DIAGNOSIS — H353132 Nonexudative age-related macular degeneration, bilateral, intermediate dry stage: Secondary | ICD-10-CM | POA: Diagnosis not present

## 2022-01-18 DIAGNOSIS — J439 Emphysema, unspecified: Secondary | ICD-10-CM | POA: Diagnosis not present

## 2022-01-18 DIAGNOSIS — Z79899 Other long term (current) drug therapy: Secondary | ICD-10-CM | POA: Diagnosis not present

## 2022-01-18 DIAGNOSIS — R202 Paresthesia of skin: Secondary | ICD-10-CM | POA: Diagnosis not present

## 2022-01-18 DIAGNOSIS — M5136 Other intervertebral disc degeneration, lumbar region: Secondary | ICD-10-CM | POA: Diagnosis not present

## 2022-01-18 DIAGNOSIS — I10 Essential (primary) hypertension: Secondary | ICD-10-CM | POA: Diagnosis not present

## 2022-01-18 DIAGNOSIS — I700 Atherosclerosis of aorta: Secondary | ICD-10-CM | POA: Diagnosis not present

## 2022-01-18 DIAGNOSIS — Z6841 Body Mass Index (BMI) 40.0 and over, adult: Secondary | ICD-10-CM | POA: Diagnosis not present

## 2022-01-18 DIAGNOSIS — E7800 Pure hypercholesterolemia, unspecified: Secondary | ICD-10-CM | POA: Diagnosis not present

## 2022-02-02 DIAGNOSIS — Z87891 Personal history of nicotine dependence: Secondary | ICD-10-CM | POA: Diagnosis not present

## 2022-02-27 ENCOUNTER — Other Ambulatory Visit: Payer: Self-pay | Admitting: Cardiology

## 2022-03-03 DIAGNOSIS — Z8546 Personal history of malignant neoplasm of prostate: Secondary | ICD-10-CM | POA: Diagnosis not present

## 2022-03-03 DIAGNOSIS — N35012 Post-traumatic membranous urethral stricture: Secondary | ICD-10-CM | POA: Diagnosis not present

## 2022-03-22 ENCOUNTER — Other Ambulatory Visit: Payer: Self-pay | Admitting: *Deleted

## 2022-03-22 MED ORDER — NITROGLYCERIN 0.4 MG SL SUBL: 0.4000 mg | SUBLINGUAL_TABLET | SUBLINGUAL | 3 refills | Status: AC | PRN

## 2022-04-13 DIAGNOSIS — Z0100 Encounter for examination of eyes and vision without abnormal findings: Secondary | ICD-10-CM | POA: Diagnosis not present

## 2022-04-30 DIAGNOSIS — R3912 Poor urinary stream: Secondary | ICD-10-CM | POA: Diagnosis not present

## 2022-04-30 DIAGNOSIS — N35011 Post-traumatic bulbous urethral stricture: Secondary | ICD-10-CM | POA: Diagnosis not present

## 2022-05-17 DIAGNOSIS — R609 Edema, unspecified: Secondary | ICD-10-CM | POA: Diagnosis not present

## 2022-05-20 ENCOUNTER — Ambulatory Visit: Payer: Medicare HMO | Admitting: Cardiology

## 2022-05-20 ENCOUNTER — Encounter: Payer: Self-pay | Admitting: Cardiology

## 2022-05-20 DIAGNOSIS — R600 Localized edema: Secondary | ICD-10-CM

## 2022-05-20 DIAGNOSIS — E7800 Pure hypercholesterolemia, unspecified: Secondary | ICD-10-CM | POA: Diagnosis not present

## 2022-05-20 DIAGNOSIS — I2510 Atherosclerotic heart disease of native coronary artery without angina pectoris: Secondary | ICD-10-CM

## 2022-05-20 NOTE — Assessment & Plan Note (Signed)
He may increase his Lasix to 40 mg over the next several days to help decrease fluid.  He also can go to a elastic therapy in [place] to get compression hose.

## 2022-05-20 NOTE — Assessment & Plan Note (Signed)
Crestor 20 mg a day no myalgias.  Continue with current prescription drug management.  LDL 44 at last check.  Excellent.  At goal.

## 2022-05-20 NOTE — Patient Instructions (Signed)
Medication Instructions:  Please take Furosemide to 40 mg for the next 3 to 4 days to help with swelling then return to your normal dose. Continue all other medications as listed.  *If you need a refill on your cardiac medications before your next appointment, please call your pharmacy*  Follow-Up: At [facility], you and your health needs are our priority.  As part of our continuing mission to provide you with exceptional heart care, we have created designated Provider Care Teams.  These Care Teams include your primary Cardiologist (physician) and Advanced Practice Providers (APPs -  Physician Assistants and Nurse Practitioners) who all work together to provide you with the care you need, when you need it.  We recommend signing up for the patient portal called "MyChart".  Sign up information is provided on this After Visit Summary.  MyChart is used to connect with patients for Virtual Visits (Telemedicine).  Patients are able to view lab/test results, encounter notes, upcoming appointments, etc.  Non-urgent messages can be sent to your provider as well.   To learn more about what you can do with MyChart, go to [email or web address].    Your next appointment:   1 year(s)  The format for your next appointment:   In Person  Provider:   Candee Furbish, MD {   Important Information About Sugar

## 2022-05-20 NOTE — Progress Notes (Signed)
Cardiology Office Note:    Date:  05/20/2022   ID:  Aaron King, [name] 12-Nov-1945, MRN [id number]  PCP:  Leonides Sake, MD   [place]  Cardiologist:  Candee Furbish, MD  Advanced Practice Provider:  No care team member to display Electrophysiologist:  None       Referring MD: Leonides Sake, MD    History of Present Illness:    Aaron King is a 76 y.o. male here for the follow up of coronary artery disease, hypertension, and hyperlipidemia.  On 09/10/2021 preoperative clearance was requested for urethral balloon dilation, to be performed by Dr. Junious Silk at [facility] urology. Plavix would be held for 5 days prior. This was performed 10/13/2021.  Has coronary artery disease status post non-ST elevation myocardial infarction in 2011.  Had PCI to obtuse marginal at that time.  Drug-eluting stent. On Plavix monotherapy.  He was admitted again in 2014 with mid scapular upper back pain.  Cardiac enzymes were negative.  To have noncardiac chest pain.  Has seen Dr. Aundra Dubin as well as Truitt Merle in the past.  In 2019 had stress test which overall was low risk.  At his last appointment he was on rosuvastatin 20 mg for hyperlipidemia. His blood pressure was better controlled at home, sometimes elevated in doctor's office. Also complained of urinary hesitancy's. Had prostate cancer status post radiation therapy and seed implantation in 2010. Seeing Dr. Junious Silk. Additionally he reported burning sensations in his feet, planned to check LE ABIs/vascular studies (Performed 01/2021, was normal).  He is a former smoker; quit after MI in 2011.  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  Most recent surgery was hip surgery.  Was doing well.  Today: He states he has been feeling good from a cardiovascular perspective. His main concern today is his ongoing issues with urinary frequency. Usually he needs to use the restroom 4-5 times every night.  He still follows with Dr. Junious Silk and he is scheduled for another urology procedure on 05/28/22.    Recently he endorses bilateral LE edema of his feet; he says his current level of swelling is much improved.  He also notes that his memory recall is not as quick as it used to be, which he attributes to his age.  In March 2023, his LDL was 44. Regarding his diet he typically does not eat much and he doesn't add any extra salt.  He denies any palpitations, chest pain, or shortness of breath. No lightheadedness, headaches, syncope, orthopnea, or PND.    Past Medical History:  Diagnosis Date   Anticoagulant long-term use    plavix   CAD (coronary artery disease) cardiologist--- dr dalton mclean   a. 05/2010 NSTEMI/Cath/PCI: LM 5, LAD min irregs, LCX min irregs, OM1 99(2.5x12 Promus DES), RCA 30p, EF 65%;  b. effient d/c 2/2 to gum bleeding and epistaxis   COPD with emphysema ([place])    DOE (dyspnea on exertion)    Dyslipidemia    ETOH abuse    Gross hematuria    History of kidney stones    History of non-ST elevation myocardial infarction (NSTEMI) 05/2010   s/p  PCI and DES x1   History of prostate cancer    s/p XRT and seed implantation in May 2010   Hypertension    Myocardial infarction [facility])    Nocturia    Primary localized osteoarthritis of right hip 05/13/2020   Prostate cancer ([place])  S/P drug eluting coronary stent placement 05/2010   Urethral stricture 05/13/2020   Urology says use 14 french catheter   Dr Junious Silk said to place in preop.  Call Urology if any issues in preop placing this catheter.  [phone]   Urethral stricture in male     Past Surgical History:  Procedure Laterality Date   APPENDECTOMY  1960   COLONOSCOPY WITH PROPOFOL  last one 2017   CORONARY ANGIOPLASTY WITH STENT PLACEMENT  05-15-2010  DR CRENSHAW   PCI W/ DRUG-ELUTING STENT X1 IN THE FIRST OBTUSE MARGINAL BRANCH OF CIRCUMFLEX ARTERY/ NON-STEMI/  EF 65%   CYSTOSCOPY WITH URETHRAL DILATATION N/A  03/06/2019   Procedure: CYSTOSCOPY WITH URETHRAL STRICTURE DILATATION, FOLEY CATHETER PLACEMENT;  Surgeon: Festus Aloe, MD;  Location: [facility];  Service: Urology;  Laterality: N/A;   CYSTOSCOPY WITH URETHRAL DILATATION N/A 10/13/2021   Procedure: CYSTOSCOPY WITH OPTILUME URETHRAL DILATATION;  Surgeon: Festus Aloe, MD;  Location: [facility];  Service: Urology;  Laterality: N/A;  ONLY NEEDS 60 MIN   MASS EXCISION  08/23/2012   Procedure: EXCISION MASS;  Surgeon: Leighton Ruff, MD;  Location: [facility];  Service: General;  Laterality: Left;  excisional biopsy left thigh mass   PROSTATE BIOPSY     RADIOACTIVE PROSTATE SEED IMPLANTS  03/31/2009   PROSTATE CANCER   REPAIR RIGHT INGUINAL HERNIA  1980   TOTAL HIP ARTHROPLASTY Right 06/03/2020   Procedure: TOTAL HIP ARTHROPLASTY ANTERIOR APPROACH;  Surgeon: Renette Butters, MD;  Location: [facility];  Service: Orthopedics;  Laterality: Right;    Current Medications: Current Meds  Medication Sig   albuterol (PROVENTIL HFA;VENTOLIN HFA) 108 (90 Base) MCG/ACT inhaler Inhale 2 puffs into the lungs every 4 (four) hours as needed for wheezing or shortness of breath.   clopidogrel (PLAVIX) 75 MG tablet Take 1 tablet by mouth once daily   furosemide (LASIX) 20 MG tablet Take 20 mg by mouth daily with breakfast.   metoprolol succinate (TOPROL-XL) 25 MG 24 hr tablet Take 25 mg by mouth every morning.   nitroGLYCERIN (NITROSTAT) 0.4 MG SL tablet Place 1 tablet (0.4 mg total) under the tongue every 5 (five) minutes as needed.   rosuvastatin (CRESTOR) 20 MG tablet Take 20 mg by mouth at bedtime.   Tamsulosin HCl (FLOMAX) 0.4 MG CAPS Take 0.4 mg by mouth daily.   triamcinolone cream (KENALOG) 0.1 % Apply 1 application topically 2 (two) times daily as needed (rash).     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  Retired from [employer].   Social Determinants of Health   Financial Resource Strain: Not on file  Food Insecurity: Not on file  Transportation Needs: Not on file  Physical Activity: Not on file  Stress: Not on file  Social Connections: Not on file     Family History: The patient's family history includes Congestive Heart Failure in his father; Heart attack in his mother; Heart disease in his father and mother; Prostate cancer in his brother.  ROS:   Please see the history of present illness.    (+) Urinary frequency (+) Bilateral LE edema to his feet All other systems reviewed and are negative.  EKGs/Labs/Other Studies Reviewed:    The following studies were reviewed today:  LE Doppler Study  02/02/2021: Summary:  Right: Resting right ankle-brachial index is within normal range. No  evidence of significant right lower extremity arterial disease. The right  toe-brachial index is normal.   Left: Resting left ankle-brachial index is within normal range. No  evidence of significant left lower extremity arterial disease. The left  toe-brachial index is normal.   CT Chest  08/11/2018: FINDINGS: Cardiovascular: Calcified aortic atherosclerosis.  Calcified coronary artery atherosclerosis and/or stents redemonstrated. No cardiomegaly or pericardial effusion.   Mediastinum/Nodes: Stable and negative.   Lungs/Pleura: The right lower lobe costophrenic angle nodule/opacity has resolved.   Upper lobe centrilobular emphysema is evident. No new right lung opacity. No left lung nodule. No pleural effusion.   Major airways remain patent.   Upper Abdomen: Hepatic steatosis is less apparent since November 2018. Otherwise negative.   Musculoskeletal: No acute osseous abnormality identified.  IMPRESSION: 1. The right lower lobe nodule has completely resolved since May 2018. No acute pulmonary process or lung nodule identified today. 2. Aortic Atherosclerosis (ICD10-I70.0) and Emphysema (ICD10-J43.9). Calcified coronary artery atherosclerosis.  Myoview Study Highlights 08/2018   The left ventricular ejection fraction is hyperdynamic (>65%). Nuclear stress EF: 67%. There was no ST segment deviation noted during stress. The study is normal. This is a low risk study.     EKG:  EKG is personally reviewed. 05/20/2022:  Sinus rhythm. Rate 65 bpm. 01/22/2021: sinus rhythm 60 with no other abnormalities.  Recent Labs: 09/17/2021: BUN 11; Creatinine, Ser 0.93; Hemoglobin 14.7; Platelets 278; Potassium 4.2; Sodium 141   Recent Lipid Panel    Component Value Date/Time   CHOL 132 05/26/2016 1515   TRIG 121 05/26/2016 1515   HDL 72 05/26/2016 1515   CHOLHDL 1.8 05/26/2016 1515   VLDL 24 05/26/2016 1515   LDLCALC 36 05/26/2016 1515     Risk Assessment/Calculations:      Physical Exam:    VS:  BP 110/60 (BP Location: Left Arm, Patient Position: Sitting, Cuff Size: Normal)   Pulse 65   Ht '5\' 8"'$  (1.727 m)   Wt 247 lb (112 kg)   BMI 37.56 kg/m     Wt Readings from Last 3 Encounters:  05/20/22 247 lb (112 kg)  09/17/21 243 lb (110.2 kg)  01/22/21 243 lb (110.2 kg)     GEN: Well nourished, well developed in no acute  distress HEENT: Normal NECK: No JVD; No carotid bruits LYMPHATICS: No lymphadenopathy CARDIAC: RRR, no murmurs, rubs, gallops RESPIRATORY:  Clear to auscultation without rales, wheezing or rhonchi  ABDOMEN: Soft, non-tender, non-distended MUSCULOSKELETAL:  No edema; No deformity  SKIN: Warm and dry  NEUROLOGIC:  Alert and oriented x 3 PSYCHIATRIC:  Normal affect   ASSESSMENT:    1. Coronary artery disease involving native coronary artery of native heart without angina pectoris   2. Pure hypercholesterolemia   3. Lower extremity edema     PLAN:    In order of problems listed above:  Coronary artery disease involving native coronary artery of native heart without angina pectoris Prior non-ST elevation myocardial infarction in 2011.  PCI to obtuse marginal.  DES.  He has been on Plavix monotherapy and doing very well.  No symptoms.  Continue with current prescription drug management.  He is also on Toprol 25 a day.  Pure hypercholesterolemia Crestor 20 mg a day no myalgias.  Continue with current prescription drug management.  LDL 44 at last check.  Excellent.  At goal.  Lower extremity edema He may increase his Lasix to 40 mg over the next several days to help decrease fluid.  He also can go to a elastic therapy in [place] to get compression hose.   Follow-up:   1 year.  Medication Adjustments/Labs and Tests Ordered: Current medicines are reviewed at length with the patient today.  Concerns regarding medicines are outlined above.   Orders Placed This Encounter  Procedures   EKG 12-Lead   No orders of the defined types were placed in this encounter.  Patient Instructions  Medication Instructions:  Please take Furosemide to 40 mg for the next 3 to 4 days to help with swelling then return to your normal dose. Continue all other medications as listed.  *If you need a refill on your cardiac medications before your next appointment, please call your  pharmacy*  Follow-Up: At [facility], you and your health needs are our priority.  As part of our continuing mission to provide you with exceptional heart care, we have created designated Provider Care Teams.  These Care Teams include your primary Cardiologist (physician) and Advanced Practice Providers (APPs -  Physician Assistants and Nurse Practitioners) who all work together to provide you with the care you need, when you need it.  We recommend signing up for the patient portal called "MyChart".  Sign up information is provided on this After Visit Summary.  MyChart is used to connect with patients for Virtual Visits (Telemedicine).  Patients are able to view lab/test results, encounter notes, upcoming appointments, etc.  Non-urgent messages can be sent to your provider as well.   To learn more about what you can do with MyChart, go to [email or web address].    Your next appointment:   1 year(s)  The format for your next appointment:   In Person  Provider:   Candee Furbish, MD {   Important Information About Sugar         I,Mathew Stumpf,acting as a scribe for Candee Furbish, MD.,have documented all relevant documentation on the behalf of Candee Furbish, MD,as directed by  Candee Furbish, MD while in the presence of Candee Furbish, MD.  I, Candee Furbish, MD, have reviewed all documentation for this visit. The documentation on 05/20/22 for the exam, diagnosis, procedures, and orders are all accurate and complete.   Signed, Candee Furbish, MD  05/20/2022 11:26 AM    [facility]

## 2022-05-20 NOTE — Assessment & Plan Note (Signed)
Prior non-ST elevation myocardial infarction in 2011.  PCI to obtuse marginal.  DES.  He has been on Plavix monotherapy and doing very well.  No symptoms.  Continue with current prescription drug management.  He is also on Toprol 25 a day.

## 2022-05-21 ENCOUNTER — Other Ambulatory Visit: Payer: Self-pay | Admitting: Cardiology

## 2022-05-28 DIAGNOSIS — N35011 Post-traumatic bulbous urethral stricture: Secondary | ICD-10-CM | POA: Diagnosis not present

## 2022-07-21 DIAGNOSIS — I700 Atherosclerosis of aorta: Secondary | ICD-10-CM | POA: Diagnosis not present

## 2022-07-21 DIAGNOSIS — Z79899 Other long term (current) drug therapy: Secondary | ICD-10-CM | POA: Diagnosis not present

## 2022-07-21 DIAGNOSIS — Z139 Encounter for screening, unspecified: Secondary | ICD-10-CM | POA: Diagnosis not present

## 2022-07-21 DIAGNOSIS — D692 Other nonthrombocytopenic purpura: Secondary | ICD-10-CM | POA: Diagnosis not present

## 2022-07-21 DIAGNOSIS — Z9181 History of falling: Secondary | ICD-10-CM | POA: Diagnosis not present

## 2022-07-21 DIAGNOSIS — I10 Essential (primary) hypertension: Secondary | ICD-10-CM | POA: Diagnosis not present

## 2022-07-21 DIAGNOSIS — K760 Fatty (change of) liver, not elsewhere classified: Secondary | ICD-10-CM | POA: Diagnosis not present

## 2022-07-21 DIAGNOSIS — E7800 Pure hypercholesterolemia, unspecified: Secondary | ICD-10-CM | POA: Diagnosis not present

## 2022-07-21 DIAGNOSIS — J439 Emphysema, unspecified: Secondary | ICD-10-CM | POA: Diagnosis not present

## 2022-08-17 ENCOUNTER — Telehealth: Payer: Self-pay

## 2022-08-17 NOTE — Patient Outreach (Signed)
  Care Coordination   Initial Visit Note   08/17/2022 Name: Aaron King MRN: [id number] DOB: 06-Feb-1946  Aaron King is a 76 y.o. year old male who sees Hamrick, Lorin Mercy, MD for primary care. I spoke with  Aaron King by phone today.  What matters to the patients health and wellness today?  Placed call to patient today and reviewed [facility] care coordination program.  Patient declines  needs at this time.   SDOH assessments and interventions completed:  No     Care Coordination Interventions Activated:  No  Care Coordination Interventions:  No, not indicated   Follow up plan: No further intervention required.   Encounter Outcome:  Pt. Refused   Tomasa Rand, RN, BSN, CEN [facility] [employer] [phone]

## 2022-08-26 DIAGNOSIS — Z8546 Personal history of malignant neoplasm of prostate: Secondary | ICD-10-CM | POA: Diagnosis not present

## 2022-08-26 DIAGNOSIS — R351 Nocturia: Secondary | ICD-10-CM | POA: Diagnosis not present

## 2022-08-26 DIAGNOSIS — N401 Enlarged prostate with lower urinary tract symptoms: Secondary | ICD-10-CM | POA: Diagnosis not present

## 2022-10-29 DIAGNOSIS — J018 Other acute sinusitis: Secondary | ICD-10-CM | POA: Diagnosis not present

## 2022-10-29 DIAGNOSIS — R059 Cough, unspecified: Secondary | ICD-10-CM | POA: Diagnosis not present

## 2022-12-29 DIAGNOSIS — M5137 Other intervertebral disc degeneration, lumbosacral region: Secondary | ICD-10-CM | POA: Diagnosis not present

## 2022-12-29 DIAGNOSIS — R202 Paresthesia of skin: Secondary | ICD-10-CM | POA: Diagnosis not present

## 2022-12-29 DIAGNOSIS — Z1331 Encounter for screening for depression: Secondary | ICD-10-CM | POA: Diagnosis not present

## 2023-01-07 ENCOUNTER — Ambulatory Visit (INDEPENDENT_AMBULATORY_CARE_PROVIDER_SITE_OTHER): Payer: Medicare Other | Admitting: Podiatry

## 2023-01-07 ENCOUNTER — Encounter: Payer: Self-pay | Admitting: Podiatry

## 2023-01-07 DIAGNOSIS — M792 Neuralgia and neuritis, unspecified: Secondary | ICD-10-CM

## 2023-01-07 DIAGNOSIS — E1142 Type 2 diabetes mellitus with diabetic polyneuropathy: Secondary | ICD-10-CM

## 2023-01-07 MED ORDER — GABAPENTIN 300 MG PO CAPS: 300.0000 mg | ORAL_CAPSULE | Freq: Three times a day (TID) | ORAL | 3 refills | Status: DC

## 2023-01-07 NOTE — Progress Notes (Signed)
Subjective:  Patient ID: Aaron King, male    DOB: [birth year],  MRN: [id number]  Chief Complaint  Patient presents with   Numbness    PARESTHESIA OF BOTH FEET Referring Provider: Leonides Sake    77 y.o. male presents with concern for paresthesia of both feet.  He relates pins-and-needles burning tingling sensation in both feet.  He does have a history of a back problem with a disc herniation in the past that has caused some nerve pain.  He is unsure if he has diabetes.  He says he has not been recently tested for it.  He was recently put on gabapentin by his primary doctor approximately 1 week ago.  Has not noticed a ton of effect for this he is only been taking it once a day at this time.  Denies any side effects with regards to taking the medication  Past Medical History:  Diagnosis Date   Anticoagulant long-term use    plavix   CAD (coronary artery disease) cardiologist--- dr dalton mclean   a. 05/2010 NSTEMI/Cath/PCI: LM 39, LAD min irregs, LCX min irregs, OM1 99(2.5x12 Promus DES), RCA 30p, EF 65%;  b. effient d/c 2/2 to gum bleeding and epistaxis   COPD with emphysema ([place])    DOE (dyspnea on exertion)    Dyslipidemia    ETOH abuse    Gross hematuria    History of kidney stones    History of non-ST elevation myocardial infarction (NSTEMI) 05/2010   s/p  PCI and DES x1   History of prostate cancer    s/p XRT and seed implantation in May 2010   Hypertension    Myocardial infarction [facility])    Nocturia    Primary localized osteoarthritis of right hip 05/13/2020   Prostate cancer ([place])    S/P drug eluting coronary stent placement 05/2010   Urethral stricture 05/13/2020   Urology says use 14 french catheter   Dr Junious Silk said to place in preop.  Call Urology if any issues in preop placing this catheter.  [phone]   Urethral stricture in male     Allergies  Allergen Reactions   Oxycodone Other (See Comments)    Lethargic, "puts me in [place]"    ROS: Negative  except as per HPI above  Objective:  General: AAO x3, NAD  Dermatological: With inspection and palpation of the right and left lower extremities there are no open sores, no preulcerative lesions, no rash or signs of infection present. Nails are of normal length thickness and coloration.   Vascular:  Dorsalis Pedis artery and Posterior Tibial artery pedal pulses are 1/4 bilateral.  Capillary fill time < 3 sec to all digits.   Neruologic: Grossly diminished via light touch protective sensation was intact.  Subjective sensation of burning tingling pins and needle sensation both feet  Musculoskeletal: No gross boney pedal deformities bilateral. No pain, crepitus, or limitation noted with foot and ankle range of motion bilateral. Muscular strength 5/5 in all groups tested bilateral.  Gait: Unassisted, Nonantalgic.   No images are attached to the encounter.   Assessment:   1. Neuropathic pain   2. DM type 2 with diabetic peripheral neuropathy ([place])      Plan:  Patient was evaluated and treated and all questions answered.  # Neuropathic pain likely related to type 2 diabetes versus idiopathic neuropathy versus lower back origin -Discussed with the patient that it does sound like he has neuropathic pain with regards to burning tingling pins  and needle sensation in both feet -I recommend increased dose of gabapentin 300 mg 3 times a day -E Rx gabapentin 300 mg take 3 times daily for the next 90 days -Discussed the risks and side effects associate with gabapentin including drowsiness and he should not operate or drive machinery until he knows how will affect him. -I also recommend being evaluated with his A1c to rule out any possibility of type 2 diabetes -[facility] prescription provided  Return in about 3 months (around 04/09/2023) for Follow-up neuropathy.          Everitt Amber, DPM [facility]

## 2023-01-08 LAB — HEMOGLOBIN A1C
Est. average glucose Bld gHb Est-mCnc: 105 mg/dL
Hgb A1c MFr Bld: 5.3 % (ref 4.8–5.6)

## 2023-01-19 DIAGNOSIS — E7800 Pure hypercholesterolemia, unspecified: Secondary | ICD-10-CM | POA: Diagnosis not present

## 2023-01-19 DIAGNOSIS — I700 Atherosclerosis of aorta: Secondary | ICD-10-CM | POA: Diagnosis not present

## 2023-01-19 DIAGNOSIS — Z8546 Personal history of malignant neoplasm of prostate: Secondary | ICD-10-CM | POA: Diagnosis not present

## 2023-01-19 DIAGNOSIS — J439 Emphysema, unspecified: Secondary | ICD-10-CM | POA: Diagnosis not present

## 2023-01-19 DIAGNOSIS — I10 Essential (primary) hypertension: Secondary | ICD-10-CM | POA: Diagnosis not present

## 2023-01-19 DIAGNOSIS — Z79899 Other long term (current) drug therapy: Secondary | ICD-10-CM | POA: Diagnosis not present

## 2023-01-25 DIAGNOSIS — R918 Other nonspecific abnormal finding of lung field: Secondary | ICD-10-CM | POA: Diagnosis not present

## 2023-01-25 DIAGNOSIS — Z87891 Personal history of nicotine dependence: Secondary | ICD-10-CM | POA: Diagnosis not present

## 2023-01-25 DIAGNOSIS — J432 Centrilobular emphysema: Secondary | ICD-10-CM | POA: Diagnosis not present

## 2023-01-25 DIAGNOSIS — K760 Fatty (change of) liver, not elsewhere classified: Secondary | ICD-10-CM | POA: Diagnosis not present

## 2023-01-25 DIAGNOSIS — I2510 Atherosclerotic heart disease of native coronary artery without angina pectoris: Secondary | ICD-10-CM | POA: Diagnosis not present

## 2023-01-25 DIAGNOSIS — M47816 Spondylosis without myelopathy or radiculopathy, lumbar region: Secondary | ICD-10-CM | POA: Diagnosis not present

## 2023-01-25 DIAGNOSIS — Z122 Encounter for screening for malignant neoplasm of respiratory organs: Secondary | ICD-10-CM | POA: Diagnosis not present

## 2023-01-25 DIAGNOSIS — I700 Atherosclerosis of aorta: Secondary | ICD-10-CM | POA: Diagnosis not present

## 2023-01-25 DIAGNOSIS — M5126 Other intervertebral disc displacement, lumbar region: Secondary | ICD-10-CM | POA: Diagnosis not present

## 2023-01-27 DIAGNOSIS — R5383 Other fatigue: Secondary | ICD-10-CM | POA: Diagnosis not present

## 2023-02-23 DIAGNOSIS — N35012 Post-traumatic membranous urethral stricture: Secondary | ICD-10-CM | POA: Diagnosis not present

## 2023-02-23 DIAGNOSIS — R3912 Poor urinary stream: Secondary | ICD-10-CM | POA: Diagnosis not present

## 2023-02-23 DIAGNOSIS — N401 Enlarged prostate with lower urinary tract symptoms: Secondary | ICD-10-CM | POA: Diagnosis not present

## 2023-02-23 DIAGNOSIS — Z8546 Personal history of malignant neoplasm of prostate: Secondary | ICD-10-CM | POA: Diagnosis not present

## 2023-03-31 DIAGNOSIS — N35012 Post-traumatic membranous urethral stricture: Secondary | ICD-10-CM | POA: Diagnosis not present

## 2023-04-06 ENCOUNTER — Telehealth: Payer: Self-pay | Admitting: Podiatry

## 2023-04-06 NOTE — Telephone Encounter (Signed)
Patient is calling to cancel his follow-up as he does not remember making it. Can you call to confirm this with him? He said it is OK to leave a VM.

## 2023-04-11 ENCOUNTER — Ambulatory Visit (INDEPENDENT_AMBULATORY_CARE_PROVIDER_SITE_OTHER): Payer: Medicare Other | Admitting: Podiatry

## 2023-04-11 DIAGNOSIS — E1142 Type 2 diabetes mellitus with diabetic polyneuropathy: Secondary | ICD-10-CM | POA: Diagnosis not present

## 2023-04-11 DIAGNOSIS — M792 Neuralgia and neuritis, unspecified: Secondary | ICD-10-CM | POA: Diagnosis not present

## 2023-04-11 NOTE — Progress Notes (Signed)
Subjective:  Patient ID: Aaron King, male    DOB: May 26, 1946,  MRN: [id number]  Chief Complaint  Patient presents with   Follow-up    Neuropathic Pain. Patient continues to experience pain. Continues to take gabapentin 300mg  TID. Latest A1C 5.3 (March 2024)    77 y.o. male presents with concern for paresthesia of both feet.  He relates pins-and-needles burning tingling sensation in both feet.  He does have a history of a back problem with a disc herniation in the past that has caused some nerve pain.  He is unsure if he has diabetes.  Patient has previously been on gabapentin 300 mg 3 times a day.  This was an increased dose from what he was previously placed on.  He says he has not noticed any improvement.  He did have his A1c checked after last appointment found to be 5.3.  Past Medical History:  Diagnosis Date   Anticoagulant long-term use    plavix   CAD (coronary artery disease) cardiologist--- dr dalton mclean   a. 05/2010 NSTEMI/Cath/PCI: LM 20, LAD min irregs, LCX min irregs, OM1 99(2.5x12 Promus DES), RCA 30p, EF 65%;  b. 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  Call Urology if any issues in preop placing this catheter.  [phone]   Urethral stricture in male     Allergies  Allergen Reactions   Oxycodone Other (See Comments)    Lethargic, "puts me in [place]"     ROS: Negative except as per HPI above  Objective:  General: AAO x3, NAD  Dermatological: With inspection and palpation of the right and left lower extremities there are no open sores, no preulcerative lesions, no rash or signs of infection present. Nails are of normal length thickness and coloration.   Vascular:  Dorsalis Pedis artery and Posterior Tibial artery pedal pulses are 1/4 bilateral.  Capillary fill time < 3 sec to all digits.   Neruologic: Grossly diminished via light touch protective sensation was intact.  Subjective sensation of burning tingling pins and needle sensation both feet  Musculoskeletal: No gross boney pedal deformities bilateral. No pain, crepitus, or limitation noted with foot and ankle range of motion bilateral. Muscular strength 5/5 in all groups tested bilateral.  Gait: Unassisted, Nonantalgic.   No images are attached to the encounter.   Assessment:   1. Neuropathic pain   2. DM type 2 with diabetic peripheral neuropathy (HCC)       Plan:  Patient was evaluated and treated and all questions answered.  # Neuropathic pain likely related to type 2 diabetes versus idiopathic neuropathy versus lower back origin -I recommend increased dose of gabapentin 600 mg 3 times a day-patient has enough gabapentin from prior prescription and  will call if he needs a refill -Recommend referral to neurology clinic for further evaluation as we have not been having success with titration of gabapentin -Discussed the risks and side effects associate with gabapentin including drowsiness and he should not operate or drive machinery until he knows how will affect him. -Patient had his A1c checked previously January 07, 2023 and the value was 5.3  Return if symptoms worsen or fail to improve.          Corinna Gab, DPM [facility] / [facility]

## 2023-05-05 DIAGNOSIS — R3912 Poor urinary stream: Secondary | ICD-10-CM | POA: Diagnosis not present

## 2023-05-05 DIAGNOSIS — N453 Epididymo-orchitis: Secondary | ICD-10-CM | POA: Diagnosis not present

## 2023-05-05 DIAGNOSIS — N35012 Post-traumatic membranous urethral stricture: Secondary | ICD-10-CM | POA: Diagnosis not present

## 2023-05-05 DIAGNOSIS — N401 Enlarged prostate with lower urinary tract symptoms: Secondary | ICD-10-CM | POA: Diagnosis not present

## 2023-05-12 ENCOUNTER — Other Ambulatory Visit: Payer: Self-pay | Admitting: Podiatry

## 2023-05-25 DIAGNOSIS — R2689 Other abnormalities of gait and mobility: Secondary | ICD-10-CM | POA: Diagnosis not present

## 2023-05-25 DIAGNOSIS — R42 Dizziness and giddiness: Secondary | ICD-10-CM | POA: Diagnosis not present

## 2023-05-25 DIAGNOSIS — K921 Melena: Secondary | ICD-10-CM | POA: Diagnosis not present

## 2023-06-13 ENCOUNTER — Other Ambulatory Visit: Payer: Self-pay | Admitting: Cardiology

## 2023-06-19 ENCOUNTER — Other Ambulatory Visit: Payer: Self-pay | Admitting: Podiatry

## 2023-07-04 DIAGNOSIS — M545 Low back pain, unspecified: Secondary | ICD-10-CM | POA: Diagnosis not present

## 2023-07-10 ENCOUNTER — Other Ambulatory Visit: Payer: Self-pay | Admitting: Cardiology

## 2023-07-12 ENCOUNTER — Ambulatory Visit (INDEPENDENT_AMBULATORY_CARE_PROVIDER_SITE_OTHER): Payer: Medicare Other | Admitting: Podiatry

## 2023-07-12 ENCOUNTER — Ambulatory Visit: Payer: Medicare Other | Admitting: Podiatry

## 2023-07-12 DIAGNOSIS — H353132 Nonexudative age-related macular degeneration, bilateral, intermediate dry stage: Secondary | ICD-10-CM | POA: Diagnosis not present

## 2023-07-12 DIAGNOSIS — Z91199 Patient's noncompliance with other medical treatment and regimen due to unspecified reason: Secondary | ICD-10-CM

## 2023-07-12 NOTE — Progress Notes (Signed)
 Patient absent for apointment

## 2023-07-17 ENCOUNTER — Other Ambulatory Visit: Payer: Self-pay | Admitting: Cardiology

## 2023-07-19 ENCOUNTER — Encounter: Payer: Self-pay | Admitting: Diagnostic Neuroimaging

## 2023-07-19 ENCOUNTER — Ambulatory Visit: Payer: Medicare Other | Admitting: Diagnostic Neuroimaging

## 2023-07-19 DIAGNOSIS — M47816 Spondylosis without myelopathy or radiculopathy, lumbar region: Secondary | ICD-10-CM | POA: Diagnosis not present

## 2023-07-19 DIAGNOSIS — M6281 Muscle weakness (generalized): Secondary | ICD-10-CM | POA: Diagnosis not present

## 2023-07-19 DIAGNOSIS — R269 Unspecified abnormalities of gait and mobility: Secondary | ICD-10-CM | POA: Diagnosis not present

## 2023-07-21 DIAGNOSIS — N35012 Post-traumatic membranous urethral stricture: Secondary | ICD-10-CM | POA: Diagnosis not present

## 2023-07-26 ENCOUNTER — Other Ambulatory Visit: Payer: Self-pay | Admitting: Cardiology

## 2023-07-26 ENCOUNTER — Other Ambulatory Visit: Payer: Self-pay | Admitting: Podiatry

## 2023-07-27 DIAGNOSIS — M47816 Spondylosis without myelopathy or radiculopathy, lumbar region: Secondary | ICD-10-CM | POA: Diagnosis not present

## 2023-07-27 DIAGNOSIS — R269 Unspecified abnormalities of gait and mobility: Secondary | ICD-10-CM | POA: Diagnosis not present

## 2023-07-27 DIAGNOSIS — M6281 Muscle weakness (generalized): Secondary | ICD-10-CM | POA: Diagnosis not present

## 2023-08-03 ENCOUNTER — Other Ambulatory Visit: Payer: Self-pay | Admitting: Cardiology

## 2023-08-04 DIAGNOSIS — M6281 Muscle weakness (generalized): Secondary | ICD-10-CM | POA: Diagnosis not present

## 2023-08-04 DIAGNOSIS — M47816 Spondylosis without myelopathy or radiculopathy, lumbar region: Secondary | ICD-10-CM | POA: Diagnosis not present

## 2023-08-04 DIAGNOSIS — R269 Unspecified abnormalities of gait and mobility: Secondary | ICD-10-CM | POA: Diagnosis not present

## 2023-08-05 ENCOUNTER — Other Ambulatory Visit: Payer: Self-pay | Admitting: Cardiology

## 2023-08-08 ENCOUNTER — Other Ambulatory Visit: Payer: Self-pay | Admitting: Cardiology

## 2023-08-08 ENCOUNTER — Telehealth: Payer: Self-pay | Admitting: Cardiology

## 2023-08-08 NOTE — Telephone Encounter (Signed)
*  STAT* If patient is at the pharmacy, call can be transferred to refill team.   1. Which medications need to be refilled? (please list name of each medication and dose if known)   clopidogrel (PLAVIX) 75 MG tablet     4. Which pharmacy/location (including street and city if local pharmacy) is medication to be sent to? [facility] - [place], [place] - [address]     5. Do they need a 30 day or 90 day supply? 90   Pt states he is completely out. He is scheduled 11/4 with DR. [employer]

## 2023-08-11 DIAGNOSIS — M6281 Muscle weakness (generalized): Secondary | ICD-10-CM | POA: Diagnosis not present

## 2023-08-11 DIAGNOSIS — R269 Unspecified abnormalities of gait and mobility: Secondary | ICD-10-CM | POA: Diagnosis not present

## 2023-08-11 DIAGNOSIS — M47816 Spondylosis without myelopathy or radiculopathy, lumbar region: Secondary | ICD-10-CM | POA: Diagnosis not present

## 2023-08-15 DIAGNOSIS — M47816 Spondylosis without myelopathy or radiculopathy, lumbar region: Secondary | ICD-10-CM | POA: Diagnosis not present

## 2023-08-18 DIAGNOSIS — M6281 Muscle weakness (generalized): Secondary | ICD-10-CM | POA: Diagnosis not present

## 2023-08-18 DIAGNOSIS — R269 Unspecified abnormalities of gait and mobility: Secondary | ICD-10-CM | POA: Diagnosis not present

## 2023-08-18 DIAGNOSIS — M47816 Spondylosis without myelopathy or radiculopathy, lumbar region: Secondary | ICD-10-CM | POA: Diagnosis not present

## 2023-08-25 ENCOUNTER — Other Ambulatory Visit: Payer: Self-pay | Admitting: Podiatry

## 2023-08-25 DIAGNOSIS — R269 Unspecified abnormalities of gait and mobility: Secondary | ICD-10-CM | POA: Diagnosis not present

## 2023-08-25 DIAGNOSIS — M6281 Muscle weakness (generalized): Secondary | ICD-10-CM | POA: Diagnosis not present

## 2023-08-25 DIAGNOSIS — M47816 Spondylosis without myelopathy or radiculopathy, lumbar region: Secondary | ICD-10-CM | POA: Diagnosis not present

## 2023-08-29 DIAGNOSIS — N35012 Post-traumatic membranous urethral stricture: Secondary | ICD-10-CM | POA: Diagnosis not present

## 2023-08-29 DIAGNOSIS — R3912 Poor urinary stream: Secondary | ICD-10-CM | POA: Diagnosis not present

## 2023-09-01 DIAGNOSIS — M6281 Muscle weakness (generalized): Secondary | ICD-10-CM | POA: Diagnosis not present

## 2023-09-01 DIAGNOSIS — M47816 Spondylosis without myelopathy or radiculopathy, lumbar region: Secondary | ICD-10-CM | POA: Diagnosis not present

## 2023-09-01 DIAGNOSIS — R269 Unspecified abnormalities of gait and mobility: Secondary | ICD-10-CM | POA: Diagnosis not present

## 2023-09-08 DIAGNOSIS — M47816 Spondylosis without myelopathy or radiculopathy, lumbar region: Secondary | ICD-10-CM | POA: Diagnosis not present

## 2023-09-08 DIAGNOSIS — R269 Unspecified abnormalities of gait and mobility: Secondary | ICD-10-CM | POA: Diagnosis not present

## 2023-09-08 DIAGNOSIS — M6281 Muscle weakness (generalized): Secondary | ICD-10-CM | POA: Diagnosis not present

## 2023-09-09 DIAGNOSIS — N35011 Post-traumatic bulbous urethral stricture: Secondary | ICD-10-CM | POA: Diagnosis not present

## 2023-09-12 ENCOUNTER — Ambulatory Visit: Payer: Medicare HMO | Admitting: Cardiology

## 2023-09-15 ENCOUNTER — Encounter: Payer: Self-pay | Admitting: Cardiology

## 2023-09-15 ENCOUNTER — Ambulatory Visit: Payer: Medicare Other | Attending: Cardiology | Admitting: Cardiology

## 2023-09-15 VITALS — BP 112/68 | HR 63 | Ht 67.0 in | Wt 228.6 lb

## 2023-09-15 DIAGNOSIS — I2510 Atherosclerotic heart disease of native coronary artery without angina pectoris: Secondary | ICD-10-CM | POA: Diagnosis not present

## 2023-09-15 DIAGNOSIS — E7800 Pure hypercholesterolemia, unspecified: Secondary | ICD-10-CM

## 2023-09-15 DIAGNOSIS — R600 Localized edema: Secondary | ICD-10-CM | POA: Diagnosis not present

## 2023-09-15 NOTE — Patient Instructions (Signed)

## 2023-09-15 NOTE — Progress Notes (Signed)
  Cardiology Office Note:  .   Date:  09/15/2023  ID:  Aaron King, DOB [birth year], MRN [id number] PCP: Ailene Ravel, MD  [facility] Providers Cardiologist:  Donato Schultz, MD     History of Present Illness: .   Aaron King is a 77 y.o. 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 He reports no chest pain and is currently on Plavix monotherapy, rosuvastatin 20mg  at night, and metoprolol 25mg  daily.  The patient also reports some difficulty with mobility, but continues to remain as active as possible. He has noticed some swelling after standing for prolonged periods, and has recently purchased compression stockings for relief.  In addition, the patient has a history of urinary issues, for which he has previously undergone a procedure by Dr. Mena Goes. He reports a significant improvement in urinary flow post-procedure, but still experiences some difficulty, particularly with prolonged urination. The patient declined the option of self-catheterization.  The patient's last stress test in 2019 was normal, and his EKG shows no significant changes.         Studies Reviewed: Marland Kitchen   EKG Interpretation Date/Time:  Thursday September 15 2023 08:31:58 EST Ventricular Rate:  63 PR Interval:  180 QRS Duration:  78 QT Interval:  414 QTC Calculation: 423 R Axis:   33  Text Interpretation: Sinus rhythm with Premature supraventricular complexes Low voltage QRS Poor R wave progression When compared with ECG of 02-Aug-2013 15:22, Premature supraventricular complexes are now Present Confirmed by Donato Schultz ([id number]) on 09/15/2023 8:43:48 AM     Risk Assessment/Calculations:            Physical Exam:   VS:  BP 112/68   Pulse 63   Ht 5\' 7"  (1.702 m)   Wt 228 lb 9.6 oz  (103.7 kg)   SpO2 97%   BMI 35.80 kg/m    Wt Readings from Last 3 Encounters:  09/15/23 228 lb 9.6 oz (103.7 kg)  05/20/22 247 lb (112 kg)  09/17/21 243 lb (110.2 kg)    GEN: Well nourished, well developed in no acute distress NECK: No JVD; No carotid bruits CARDIAC: RRR, no murmurs, no rubs, no gallops RESPIRATORY:  Clear to auscultation without rales, wheezing or rhonchi  ABDOMEN: Soft, non-tender, non-distended EXTREMITIES:  1+ BLE edema; No deformity   ASSESSMENT AND PLAN: .    Assessment and Plan    Coronary Artery Disease History of myocardial infarction in 2011 with stent placement in obtuse marginal branch. No current chest pain. On Plavix monotherapy. -Continue Plavix.  Hyperlipidemia On Rosuvastatin 20mg  at night. LDL was 114 in March 2024, previously 44. -Continue Rosuvastatin 20mg  at night.  Hypertension On Metoprolol 25mg  daily. No current swelling, patient uses compression stockings for relief. -Continue Metoprolol 25mg  daily.  Urinary Issues History of urinary issues, had a procedure with Dr. Mena Goes. No current complaints. -No changes to current management.  Follow-up in 1 year.              Signed, Donato Schultz, MD

## 2023-09-18 ENCOUNTER — Other Ambulatory Visit: Payer: Self-pay | Admitting: Cardiology

## 2023-09-29 DIAGNOSIS — R9431 Abnormal electrocardiogram [ECG] [EKG]: Secondary | ICD-10-CM | POA: Diagnosis not present

## 2023-09-29 DIAGNOSIS — S80919A Unspecified superficial injury of unspecified knee, initial encounter: Secondary | ICD-10-CM | POA: Diagnosis not present

## 2023-09-29 DIAGNOSIS — Z7902 Long term (current) use of antithrombotics/antiplatelets: Secondary | ICD-10-CM | POA: Diagnosis not present

## 2023-09-29 DIAGNOSIS — Z7401 Bed confinement status: Secondary | ICD-10-CM | POA: Diagnosis not present

## 2023-09-29 DIAGNOSIS — E876 Hypokalemia: Secondary | ICD-10-CM | POA: Diagnosis not present

## 2023-09-29 DIAGNOSIS — N35919 Unspecified urethral stricture, male, unspecified site: Secondary | ICD-10-CM | POA: Diagnosis not present

## 2023-09-29 DIAGNOSIS — S3993XA Unspecified injury of pelvis, initial encounter: Secondary | ICD-10-CM | POA: Diagnosis not present

## 2023-09-29 DIAGNOSIS — R296 Repeated falls: Secondary | ICD-10-CM | POA: Diagnosis not present

## 2023-09-29 DIAGNOSIS — Z9181 History of falling: Secondary | ICD-10-CM | POA: Diagnosis not present

## 2023-09-29 DIAGNOSIS — W010XXA Fall on same level from slipping, tripping and stumbling without subsequent striking against object, initial encounter: Secondary | ICD-10-CM | POA: Diagnosis not present

## 2023-09-29 DIAGNOSIS — I10 Essential (primary) hypertension: Secondary | ICD-10-CM | POA: Diagnosis not present

## 2023-09-29 DIAGNOSIS — I2510 Atherosclerotic heart disease of native coronary artery without angina pectoris: Secondary | ICD-10-CM | POA: Diagnosis not present

## 2023-09-29 DIAGNOSIS — S32040A Wedge compression fracture of fourth lumbar vertebra, initial encounter for closed fracture: Secondary | ICD-10-CM | POA: Diagnosis not present

## 2023-09-29 DIAGNOSIS — E1142 Type 2 diabetes mellitus with diabetic polyneuropathy: Secondary | ICD-10-CM | POA: Diagnosis not present

## 2023-09-29 DIAGNOSIS — Z8546 Personal history of malignant neoplasm of prostate: Secondary | ICD-10-CM | POA: Diagnosis not present

## 2023-09-29 DIAGNOSIS — N3000 Acute cystitis without hematuria: Secondary | ICD-10-CM | POA: Diagnosis not present

## 2023-09-29 DIAGNOSIS — S32041A Stable burst fracture of fourth lumbar vertebra, initial encounter for closed fracture: Secondary | ICD-10-CM | POA: Diagnosis not present

## 2023-09-29 DIAGNOSIS — M5459 Other low back pain: Secondary | ICD-10-CM | POA: Diagnosis not present

## 2023-09-29 DIAGNOSIS — S32049A Unspecified fracture of fourth lumbar vertebra, initial encounter for closed fracture: Secondary | ICD-10-CM | POA: Diagnosis not present

## 2023-09-29 DIAGNOSIS — J449 Chronic obstructive pulmonary disease, unspecified: Secondary | ICD-10-CM | POA: Diagnosis not present

## 2023-09-29 DIAGNOSIS — N400 Enlarged prostate without lower urinary tract symptoms: Secondary | ICD-10-CM | POA: Diagnosis not present

## 2023-09-29 DIAGNOSIS — S32048A Other fracture of fourth lumbar vertebra, initial encounter for closed fracture: Secondary | ICD-10-CM | POA: Diagnosis not present

## 2023-09-29 DIAGNOSIS — R531 Weakness: Secondary | ICD-10-CM | POA: Diagnosis not present

## 2023-09-29 DIAGNOSIS — N390 Urinary tract infection, site not specified: Secondary | ICD-10-CM | POA: Diagnosis not present

## 2023-09-29 DIAGNOSIS — M25569 Pain in unspecified knee: Secondary | ICD-10-CM | POA: Diagnosis not present

## 2023-09-29 DIAGNOSIS — I252 Old myocardial infarction: Secondary | ICD-10-CM | POA: Diagnosis not present

## 2023-09-29 DIAGNOSIS — Z9861 Coronary angioplasty status: Secondary | ICD-10-CM | POA: Diagnosis not present

## 2023-09-29 DIAGNOSIS — R079 Chest pain, unspecified: Secondary | ICD-10-CM | POA: Diagnosis not present

## 2023-09-29 DIAGNOSIS — N179 Acute kidney failure, unspecified: Secondary | ICD-10-CM | POA: Diagnosis not present

## 2023-09-29 DIAGNOSIS — W19XXXD Unspecified fall, subsequent encounter: Secondary | ICD-10-CM | POA: Diagnosis not present

## 2023-09-29 DIAGNOSIS — S32040D Wedge compression fracture of fourth lumbar vertebra, subsequent encounter for fracture with routine healing: Secondary | ICD-10-CM | POA: Diagnosis not present

## 2023-09-29 DIAGNOSIS — M6282 Rhabdomyolysis: Secondary | ICD-10-CM | POA: Diagnosis not present

## 2023-09-29 DIAGNOSIS — E785 Hyperlipidemia, unspecified: Secondary | ICD-10-CM | POA: Diagnosis not present

## 2023-09-29 DIAGNOSIS — C61 Malignant neoplasm of prostate: Secondary | ICD-10-CM | POA: Diagnosis not present

## 2023-09-29 DIAGNOSIS — W19XXXA Unspecified fall, initial encounter: Secondary | ICD-10-CM | POA: Diagnosis not present

## 2023-10-09 DIAGNOSIS — Z8546 Personal history of malignant neoplasm of prostate: Secondary | ICD-10-CM | POA: Diagnosis not present

## 2023-10-09 DIAGNOSIS — E1142 Type 2 diabetes mellitus with diabetic polyneuropathy: Secondary | ICD-10-CM | POA: Diagnosis not present

## 2023-10-09 DIAGNOSIS — R600 Localized edema: Secondary | ICD-10-CM | POA: Diagnosis not present

## 2023-10-09 DIAGNOSIS — Z6834 Body mass index (BMI) 34.0-34.9, adult: Secondary | ICD-10-CM | POA: Diagnosis not present

## 2023-10-09 DIAGNOSIS — N179 Acute kidney failure, unspecified: Secondary | ICD-10-CM | POA: Diagnosis not present

## 2023-10-09 DIAGNOSIS — W19XXXD Unspecified fall, subsequent encounter: Secondary | ICD-10-CM | POA: Diagnosis not present

## 2023-10-09 DIAGNOSIS — N3000 Acute cystitis without hematuria: Secondary | ICD-10-CM | POA: Diagnosis not present

## 2023-10-09 DIAGNOSIS — R531 Weakness: Secondary | ICD-10-CM | POA: Diagnosis not present

## 2023-10-09 DIAGNOSIS — S32040A Wedge compression fracture of fourth lumbar vertebra, initial encounter for closed fracture: Secondary | ICD-10-CM | POA: Diagnosis not present

## 2023-10-09 DIAGNOSIS — N35919 Unspecified urethral stricture, male, unspecified site: Secondary | ICD-10-CM | POA: Diagnosis not present

## 2023-10-09 DIAGNOSIS — Z7401 Bed confinement status: Secondary | ICD-10-CM | POA: Diagnosis not present

## 2023-10-09 DIAGNOSIS — E876 Hypokalemia: Secondary | ICD-10-CM | POA: Diagnosis not present

## 2023-10-09 DIAGNOSIS — N400 Enlarged prostate without lower urinary tract symptoms: Secondary | ICD-10-CM | POA: Diagnosis not present

## 2023-10-09 DIAGNOSIS — N390 Urinary tract infection, site not specified: Secondary | ICD-10-CM | POA: Diagnosis not present

## 2023-10-09 DIAGNOSIS — S32040D Wedge compression fracture of fourth lumbar vertebra, subsequent encounter for fracture with routine healing: Secondary | ICD-10-CM | POA: Diagnosis not present

## 2023-10-09 DIAGNOSIS — E1143 Type 2 diabetes mellitus with diabetic autonomic (poly)neuropathy: Secondary | ICD-10-CM | POA: Diagnosis not present

## 2023-10-09 DIAGNOSIS — E785 Hyperlipidemia, unspecified: Secondary | ICD-10-CM | POA: Diagnosis not present

## 2023-10-09 DIAGNOSIS — I10 Essential (primary) hypertension: Secondary | ICD-10-CM | POA: Diagnosis not present

## 2023-10-09 DIAGNOSIS — I2510 Atherosclerotic heart disease of native coronary artery without angina pectoris: Secondary | ICD-10-CM | POA: Diagnosis not present

## 2023-10-09 DIAGNOSIS — F4320 Adjustment disorder, unspecified: Secondary | ICD-10-CM | POA: Diagnosis not present

## 2023-10-09 DIAGNOSIS — M6282 Rhabdomyolysis: Secondary | ICD-10-CM | POA: Diagnosis not present

## 2023-10-10 DIAGNOSIS — I2510 Atherosclerotic heart disease of native coronary artery without angina pectoris: Secondary | ICD-10-CM | POA: Diagnosis not present

## 2023-10-10 DIAGNOSIS — R531 Weakness: Secondary | ICD-10-CM | POA: Diagnosis not present

## 2023-10-10 DIAGNOSIS — E785 Hyperlipidemia, unspecified: Secondary | ICD-10-CM | POA: Diagnosis not present

## 2023-10-10 DIAGNOSIS — N400 Enlarged prostate without lower urinary tract symptoms: Secondary | ICD-10-CM | POA: Diagnosis not present

## 2023-10-11 DIAGNOSIS — I10 Essential (primary) hypertension: Secondary | ICD-10-CM | POA: Diagnosis not present

## 2023-10-11 DIAGNOSIS — R531 Weakness: Secondary | ICD-10-CM | POA: Diagnosis not present

## 2023-10-11 DIAGNOSIS — S32040D Wedge compression fracture of fourth lumbar vertebra, subsequent encounter for fracture with routine healing: Secondary | ICD-10-CM | POA: Diagnosis not present

## 2023-10-11 DIAGNOSIS — E1143 Type 2 diabetes mellitus with diabetic autonomic (poly)neuropathy: Secondary | ICD-10-CM | POA: Diagnosis not present

## 2023-10-14 DIAGNOSIS — R531 Weakness: Secondary | ICD-10-CM | POA: Diagnosis not present

## 2023-10-14 DIAGNOSIS — I2510 Atherosclerotic heart disease of native coronary artery without angina pectoris: Secondary | ICD-10-CM | POA: Diagnosis not present

## 2023-10-14 DIAGNOSIS — N400 Enlarged prostate without lower urinary tract symptoms: Secondary | ICD-10-CM | POA: Diagnosis not present

## 2023-10-14 DIAGNOSIS — E1143 Type 2 diabetes mellitus with diabetic autonomic (poly)neuropathy: Secondary | ICD-10-CM | POA: Diagnosis not present

## 2023-10-17 DIAGNOSIS — S32040D Wedge compression fracture of fourth lumbar vertebra, subsequent encounter for fracture with routine healing: Secondary | ICD-10-CM | POA: Diagnosis not present

## 2023-10-17 DIAGNOSIS — R531 Weakness: Secondary | ICD-10-CM | POA: Diagnosis not present

## 2023-10-17 DIAGNOSIS — E1143 Type 2 diabetes mellitus with diabetic autonomic (poly)neuropathy: Secondary | ICD-10-CM | POA: Diagnosis not present

## 2023-10-19 DIAGNOSIS — R600 Localized edema: Secondary | ICD-10-CM | POA: Diagnosis not present

## 2023-10-19 DIAGNOSIS — E1143 Type 2 diabetes mellitus with diabetic autonomic (poly)neuropathy: Secondary | ICD-10-CM | POA: Diagnosis not present

## 2023-10-19 DIAGNOSIS — R531 Weakness: Secondary | ICD-10-CM | POA: Diagnosis not present

## 2023-10-19 DIAGNOSIS — Z6834 Body mass index (BMI) 34.0-34.9, adult: Secondary | ICD-10-CM | POA: Diagnosis not present

## 2023-10-21 DIAGNOSIS — N179 Acute kidney failure, unspecified: Secondary | ICD-10-CM | POA: Diagnosis not present

## 2023-10-24 DIAGNOSIS — G4709 Other insomnia: Secondary | ICD-10-CM | POA: Diagnosis not present

## 2023-10-24 DIAGNOSIS — R531 Weakness: Secondary | ICD-10-CM | POA: Diagnosis not present

## 2023-10-25 DIAGNOSIS — I10 Essential (primary) hypertension: Secondary | ICD-10-CM | POA: Diagnosis not present

## 2023-10-25 DIAGNOSIS — E66811 Obesity, class 1: Secondary | ICD-10-CM | POA: Diagnosis not present

## 2023-10-25 DIAGNOSIS — E1143 Type 2 diabetes mellitus with diabetic autonomic (poly)neuropathy: Secondary | ICD-10-CM | POA: Diagnosis not present

## 2023-10-25 DIAGNOSIS — Z7985 Long-term (current) use of injectable non-insulin antidiabetic drugs: Secondary | ICD-10-CM | POA: Diagnosis not present

## 2023-10-25 DIAGNOSIS — N390 Urinary tract infection, site not specified: Secondary | ICD-10-CM | POA: Diagnosis not present

## 2023-10-26 DIAGNOSIS — F5101 Primary insomnia: Secondary | ICD-10-CM | POA: Diagnosis not present

## 2023-10-27 DIAGNOSIS — B9689 Other specified bacterial agents as the cause of diseases classified elsewhere: Secondary | ICD-10-CM | POA: Diagnosis not present

## 2023-10-27 DIAGNOSIS — N390 Urinary tract infection, site not specified: Secondary | ICD-10-CM | POA: Diagnosis not present

## 2023-10-28 DIAGNOSIS — N3000 Acute cystitis without hematuria: Secondary | ICD-10-CM | POA: Diagnosis not present

## 2023-10-28 DIAGNOSIS — I10 Essential (primary) hypertension: Secondary | ICD-10-CM | POA: Diagnosis not present

## 2023-10-28 DIAGNOSIS — E1143 Type 2 diabetes mellitus with diabetic autonomic (poly)neuropathy: Secondary | ICD-10-CM | POA: Diagnosis not present

## 2023-10-28 DIAGNOSIS — R531 Weakness: Secondary | ICD-10-CM | POA: Diagnosis not present

## 2023-10-31 DIAGNOSIS — I10 Essential (primary) hypertension: Secondary | ICD-10-CM | POA: Diagnosis not present

## 2023-10-31 DIAGNOSIS — I2510 Atherosclerotic heart disease of native coronary artery without angina pectoris: Secondary | ICD-10-CM | POA: Diagnosis not present

## 2023-10-31 DIAGNOSIS — E1143 Type 2 diabetes mellitus with diabetic autonomic (poly)neuropathy: Secondary | ICD-10-CM | POA: Diagnosis not present

## 2023-10-31 DIAGNOSIS — R531 Weakness: Secondary | ICD-10-CM | POA: Diagnosis not present

## 2023-11-04 DIAGNOSIS — N400 Enlarged prostate without lower urinary tract symptoms: Secondary | ICD-10-CM | POA: Diagnosis not present

## 2023-11-04 DIAGNOSIS — I2510 Atherosclerotic heart disease of native coronary artery without angina pectoris: Secondary | ICD-10-CM | POA: Diagnosis not present

## 2023-11-04 DIAGNOSIS — I10 Essential (primary) hypertension: Secondary | ICD-10-CM | POA: Diagnosis not present

## 2023-11-04 DIAGNOSIS — R531 Weakness: Secondary | ICD-10-CM | POA: Diagnosis not present

## 2023-11-07 DIAGNOSIS — I10 Essential (primary) hypertension: Secondary | ICD-10-CM | POA: Diagnosis not present

## 2023-11-07 DIAGNOSIS — N400 Enlarged prostate without lower urinary tract symptoms: Secondary | ICD-10-CM | POA: Diagnosis not present

## 2023-11-07 DIAGNOSIS — I2510 Atherosclerotic heart disease of native coronary artery without angina pectoris: Secondary | ICD-10-CM | POA: Diagnosis not present

## 2023-11-07 DIAGNOSIS — R531 Weakness: Secondary | ICD-10-CM | POA: Diagnosis not present

## 2023-11-15 DIAGNOSIS — E1143 Type 2 diabetes mellitus with diabetic autonomic (poly)neuropathy: Secondary | ICD-10-CM | POA: Diagnosis not present

## 2023-11-15 DIAGNOSIS — N400 Enlarged prostate without lower urinary tract symptoms: Secondary | ICD-10-CM | POA: Diagnosis not present

## 2023-11-15 DIAGNOSIS — E669 Obesity, unspecified: Secondary | ICD-10-CM | POA: Diagnosis not present

## 2023-11-15 DIAGNOSIS — Z6834 Body mass index (BMI) 34.0-34.9, adult: Secondary | ICD-10-CM | POA: Diagnosis not present

## 2023-11-15 DIAGNOSIS — W19XXXD Unspecified fall, subsequent encounter: Secondary | ICD-10-CM | POA: Diagnosis not present

## 2023-11-15 DIAGNOSIS — G4700 Insomnia, unspecified: Secondary | ICD-10-CM | POA: Diagnosis not present

## 2023-11-15 DIAGNOSIS — Z7902 Long term (current) use of antithrombotics/antiplatelets: Secondary | ICD-10-CM | POA: Diagnosis not present

## 2023-11-15 DIAGNOSIS — I10 Essential (primary) hypertension: Secondary | ICD-10-CM | POA: Diagnosis not present

## 2023-11-15 DIAGNOSIS — S32040D Wedge compression fracture of fourth lumbar vertebra, subsequent encounter for fracture with routine healing: Secondary | ICD-10-CM | POA: Diagnosis not present

## 2023-11-15 DIAGNOSIS — E785 Hyperlipidemia, unspecified: Secondary | ICD-10-CM | POA: Diagnosis not present

## 2023-11-15 DIAGNOSIS — I2510 Atherosclerotic heart disease of native coronary artery without angina pectoris: Secondary | ICD-10-CM | POA: Diagnosis not present

## 2023-11-15 DIAGNOSIS — Z955 Presence of coronary angioplasty implant and graft: Secondary | ICD-10-CM | POA: Diagnosis not present

## 2023-11-15 DIAGNOSIS — Z8546 Personal history of malignant neoplasm of prostate: Secondary | ICD-10-CM | POA: Diagnosis not present

## 2023-11-15 DIAGNOSIS — Z7984 Long term (current) use of oral hypoglycemic drugs: Secondary | ICD-10-CM | POA: Diagnosis not present

## 2023-11-17 DIAGNOSIS — Z955 Presence of coronary angioplasty implant and graft: Secondary | ICD-10-CM | POA: Diagnosis not present

## 2023-11-17 DIAGNOSIS — Z8546 Personal history of malignant neoplasm of prostate: Secondary | ICD-10-CM | POA: Diagnosis not present

## 2023-11-17 DIAGNOSIS — G4700 Insomnia, unspecified: Secondary | ICD-10-CM | POA: Diagnosis not present

## 2023-11-17 DIAGNOSIS — Z7902 Long term (current) use of antithrombotics/antiplatelets: Secondary | ICD-10-CM | POA: Diagnosis not present

## 2023-11-17 DIAGNOSIS — N400 Enlarged prostate without lower urinary tract symptoms: Secondary | ICD-10-CM | POA: Diagnosis not present

## 2023-11-17 DIAGNOSIS — S32040D Wedge compression fracture of fourth lumbar vertebra, subsequent encounter for fracture with routine healing: Secondary | ICD-10-CM | POA: Diagnosis not present

## 2023-11-17 DIAGNOSIS — E669 Obesity, unspecified: Secondary | ICD-10-CM | POA: Diagnosis not present

## 2023-11-17 DIAGNOSIS — E785 Hyperlipidemia, unspecified: Secondary | ICD-10-CM | POA: Diagnosis not present

## 2023-11-17 DIAGNOSIS — Z7984 Long term (current) use of oral hypoglycemic drugs: Secondary | ICD-10-CM | POA: Diagnosis not present

## 2023-11-17 DIAGNOSIS — I2510 Atherosclerotic heart disease of native coronary artery without angina pectoris: Secondary | ICD-10-CM | POA: Diagnosis not present

## 2023-11-17 DIAGNOSIS — I10 Essential (primary) hypertension: Secondary | ICD-10-CM | POA: Diagnosis not present

## 2023-11-17 DIAGNOSIS — E1143 Type 2 diabetes mellitus with diabetic autonomic (poly)neuropathy: Secondary | ICD-10-CM | POA: Diagnosis not present

## 2023-11-17 DIAGNOSIS — Z6834 Body mass index (BMI) 34.0-34.9, adult: Secondary | ICD-10-CM | POA: Diagnosis not present

## 2023-11-17 DIAGNOSIS — W19XXXD Unspecified fall, subsequent encounter: Secondary | ICD-10-CM | POA: Diagnosis not present

## 2023-11-22 DIAGNOSIS — N400 Enlarged prostate without lower urinary tract symptoms: Secondary | ICD-10-CM | POA: Diagnosis not present

## 2023-11-22 DIAGNOSIS — W19XXXD Unspecified fall, subsequent encounter: Secondary | ICD-10-CM | POA: Diagnosis not present

## 2023-11-22 DIAGNOSIS — E1143 Type 2 diabetes mellitus with diabetic autonomic (poly)neuropathy: Secondary | ICD-10-CM | POA: Diagnosis not present

## 2023-11-22 DIAGNOSIS — Z8546 Personal history of malignant neoplasm of prostate: Secondary | ICD-10-CM | POA: Diagnosis not present

## 2023-11-22 DIAGNOSIS — Z7902 Long term (current) use of antithrombotics/antiplatelets: Secondary | ICD-10-CM | POA: Diagnosis not present

## 2023-11-22 DIAGNOSIS — I10 Essential (primary) hypertension: Secondary | ICD-10-CM | POA: Diagnosis not present

## 2023-11-22 DIAGNOSIS — S32040D Wedge compression fracture of fourth lumbar vertebra, subsequent encounter for fracture with routine healing: Secondary | ICD-10-CM | POA: Diagnosis not present

## 2023-11-22 DIAGNOSIS — I2510 Atherosclerotic heart disease of native coronary artery without angina pectoris: Secondary | ICD-10-CM | POA: Diagnosis not present

## 2023-11-22 DIAGNOSIS — Z955 Presence of coronary angioplasty implant and graft: Secondary | ICD-10-CM | POA: Diagnosis not present

## 2023-11-22 DIAGNOSIS — Z7984 Long term (current) use of oral hypoglycemic drugs: Secondary | ICD-10-CM | POA: Diagnosis not present

## 2023-11-22 DIAGNOSIS — E669 Obesity, unspecified: Secondary | ICD-10-CM | POA: Diagnosis not present

## 2023-11-22 DIAGNOSIS — G4700 Insomnia, unspecified: Secondary | ICD-10-CM | POA: Diagnosis not present

## 2023-11-22 DIAGNOSIS — E785 Hyperlipidemia, unspecified: Secondary | ICD-10-CM | POA: Diagnosis not present

## 2023-11-22 DIAGNOSIS — Z6834 Body mass index (BMI) 34.0-34.9, adult: Secondary | ICD-10-CM | POA: Diagnosis not present

## 2023-11-24 ENCOUNTER — Other Ambulatory Visit: Payer: Self-pay | Admitting: Podiatry

## 2023-11-24 DIAGNOSIS — W19XXXD Unspecified fall, subsequent encounter: Secondary | ICD-10-CM | POA: Diagnosis not present

## 2023-11-24 DIAGNOSIS — N400 Enlarged prostate without lower urinary tract symptoms: Secondary | ICD-10-CM | POA: Diagnosis not present

## 2023-11-24 DIAGNOSIS — E1143 Type 2 diabetes mellitus with diabetic autonomic (poly)neuropathy: Secondary | ICD-10-CM | POA: Diagnosis not present

## 2023-11-24 DIAGNOSIS — Z6834 Body mass index (BMI) 34.0-34.9, adult: Secondary | ICD-10-CM | POA: Diagnosis not present

## 2023-11-24 DIAGNOSIS — G4700 Insomnia, unspecified: Secondary | ICD-10-CM | POA: Diagnosis not present

## 2023-11-24 DIAGNOSIS — I2510 Atherosclerotic heart disease of native coronary artery without angina pectoris: Secondary | ICD-10-CM | POA: Diagnosis not present

## 2023-11-24 DIAGNOSIS — E669 Obesity, unspecified: Secondary | ICD-10-CM | POA: Diagnosis not present

## 2023-11-24 DIAGNOSIS — Z7902 Long term (current) use of antithrombotics/antiplatelets: Secondary | ICD-10-CM | POA: Diagnosis not present

## 2023-11-24 DIAGNOSIS — I10 Essential (primary) hypertension: Secondary | ICD-10-CM | POA: Diagnosis not present

## 2023-11-24 DIAGNOSIS — S32040D Wedge compression fracture of fourth lumbar vertebra, subsequent encounter for fracture with routine healing: Secondary | ICD-10-CM | POA: Diagnosis not present

## 2023-11-24 DIAGNOSIS — E785 Hyperlipidemia, unspecified: Secondary | ICD-10-CM | POA: Diagnosis not present

## 2023-11-24 DIAGNOSIS — Z955 Presence of coronary angioplasty implant and graft: Secondary | ICD-10-CM | POA: Diagnosis not present

## 2023-11-24 DIAGNOSIS — Z7984 Long term (current) use of oral hypoglycemic drugs: Secondary | ICD-10-CM | POA: Diagnosis not present

## 2023-11-24 DIAGNOSIS — Z8546 Personal history of malignant neoplasm of prostate: Secondary | ICD-10-CM | POA: Diagnosis not present

## 2023-11-29 DIAGNOSIS — N400 Enlarged prostate without lower urinary tract symptoms: Secondary | ICD-10-CM | POA: Diagnosis not present

## 2023-11-29 DIAGNOSIS — W19XXXD Unspecified fall, subsequent encounter: Secondary | ICD-10-CM | POA: Diagnosis not present

## 2023-11-29 DIAGNOSIS — G4700 Insomnia, unspecified: Secondary | ICD-10-CM | POA: Diagnosis not present

## 2023-11-29 DIAGNOSIS — Z955 Presence of coronary angioplasty implant and graft: Secondary | ICD-10-CM | POA: Diagnosis not present

## 2023-11-29 DIAGNOSIS — E669 Obesity, unspecified: Secondary | ICD-10-CM | POA: Diagnosis not present

## 2023-11-29 DIAGNOSIS — I2510 Atherosclerotic heart disease of native coronary artery without angina pectoris: Secondary | ICD-10-CM | POA: Diagnosis not present

## 2023-11-29 DIAGNOSIS — E785 Hyperlipidemia, unspecified: Secondary | ICD-10-CM | POA: Diagnosis not present

## 2023-11-29 DIAGNOSIS — Z6834 Body mass index (BMI) 34.0-34.9, adult: Secondary | ICD-10-CM | POA: Diagnosis not present

## 2023-11-29 DIAGNOSIS — Z7902 Long term (current) use of antithrombotics/antiplatelets: Secondary | ICD-10-CM | POA: Diagnosis not present

## 2023-11-29 DIAGNOSIS — S32040D Wedge compression fracture of fourth lumbar vertebra, subsequent encounter for fracture with routine healing: Secondary | ICD-10-CM | POA: Diagnosis not present

## 2023-11-29 DIAGNOSIS — E1143 Type 2 diabetes mellitus with diabetic autonomic (poly)neuropathy: Secondary | ICD-10-CM | POA: Diagnosis not present

## 2023-11-29 DIAGNOSIS — Z7984 Long term (current) use of oral hypoglycemic drugs: Secondary | ICD-10-CM | POA: Diagnosis not present

## 2023-11-29 DIAGNOSIS — Z8546 Personal history of malignant neoplasm of prostate: Secondary | ICD-10-CM | POA: Diagnosis not present

## 2023-11-29 DIAGNOSIS — I10 Essential (primary) hypertension: Secondary | ICD-10-CM | POA: Diagnosis not present

## 2023-12-01 DIAGNOSIS — J439 Emphysema, unspecified: Secondary | ICD-10-CM | POA: Diagnosis not present

## 2023-12-01 DIAGNOSIS — D692 Other nonthrombocytopenic purpura: Secondary | ICD-10-CM | POA: Diagnosis not present

## 2023-12-01 DIAGNOSIS — R748 Abnormal levels of other serum enzymes: Secondary | ICD-10-CM | POA: Diagnosis not present

## 2023-12-01 DIAGNOSIS — E876 Hypokalemia: Secondary | ICD-10-CM | POA: Diagnosis not present

## 2023-12-01 DIAGNOSIS — I10 Essential (primary) hypertension: Secondary | ICD-10-CM | POA: Diagnosis not present

## 2023-12-01 DIAGNOSIS — E7800 Pure hypercholesterolemia, unspecified: Secondary | ICD-10-CM | POA: Diagnosis not present

## 2023-12-02 DIAGNOSIS — E669 Obesity, unspecified: Secondary | ICD-10-CM | POA: Diagnosis not present

## 2023-12-02 DIAGNOSIS — Z8546 Personal history of malignant neoplasm of prostate: Secondary | ICD-10-CM | POA: Diagnosis not present

## 2023-12-02 DIAGNOSIS — N400 Enlarged prostate without lower urinary tract symptoms: Secondary | ICD-10-CM | POA: Diagnosis not present

## 2023-12-02 DIAGNOSIS — Z7984 Long term (current) use of oral hypoglycemic drugs: Secondary | ICD-10-CM | POA: Diagnosis not present

## 2023-12-02 DIAGNOSIS — Z6834 Body mass index (BMI) 34.0-34.9, adult: Secondary | ICD-10-CM | POA: Diagnosis not present

## 2023-12-02 DIAGNOSIS — I10 Essential (primary) hypertension: Secondary | ICD-10-CM | POA: Diagnosis not present

## 2023-12-02 DIAGNOSIS — E1143 Type 2 diabetes mellitus with diabetic autonomic (poly)neuropathy: Secondary | ICD-10-CM | POA: Diagnosis not present

## 2023-12-02 DIAGNOSIS — Z955 Presence of coronary angioplasty implant and graft: Secondary | ICD-10-CM | POA: Diagnosis not present

## 2023-12-02 DIAGNOSIS — E785 Hyperlipidemia, unspecified: Secondary | ICD-10-CM | POA: Diagnosis not present

## 2023-12-02 DIAGNOSIS — W19XXXD Unspecified fall, subsequent encounter: Secondary | ICD-10-CM | POA: Diagnosis not present

## 2023-12-02 DIAGNOSIS — G4700 Insomnia, unspecified: Secondary | ICD-10-CM | POA: Diagnosis not present

## 2023-12-02 DIAGNOSIS — I2510 Atherosclerotic heart disease of native coronary artery without angina pectoris: Secondary | ICD-10-CM | POA: Diagnosis not present

## 2023-12-02 DIAGNOSIS — Z7902 Long term (current) use of antithrombotics/antiplatelets: Secondary | ICD-10-CM | POA: Diagnosis not present

## 2023-12-02 DIAGNOSIS — S32040D Wedge compression fracture of fourth lumbar vertebra, subsequent encounter for fracture with routine healing: Secondary | ICD-10-CM | POA: Diagnosis not present

## 2023-12-06 DIAGNOSIS — I10 Essential (primary) hypertension: Secondary | ICD-10-CM | POA: Diagnosis not present

## 2023-12-06 DIAGNOSIS — N400 Enlarged prostate without lower urinary tract symptoms: Secondary | ICD-10-CM | POA: Diagnosis not present

## 2023-12-06 DIAGNOSIS — Z6834 Body mass index (BMI) 34.0-34.9, adult: Secondary | ICD-10-CM | POA: Diagnosis not present

## 2023-12-06 DIAGNOSIS — E669 Obesity, unspecified: Secondary | ICD-10-CM | POA: Diagnosis not present

## 2023-12-06 DIAGNOSIS — E1143 Type 2 diabetes mellitus with diabetic autonomic (poly)neuropathy: Secondary | ICD-10-CM | POA: Diagnosis not present

## 2023-12-06 DIAGNOSIS — Z7902 Long term (current) use of antithrombotics/antiplatelets: Secondary | ICD-10-CM | POA: Diagnosis not present

## 2023-12-06 DIAGNOSIS — G4700 Insomnia, unspecified: Secondary | ICD-10-CM | POA: Diagnosis not present

## 2023-12-06 DIAGNOSIS — Z7984 Long term (current) use of oral hypoglycemic drugs: Secondary | ICD-10-CM | POA: Diagnosis not present

## 2023-12-06 DIAGNOSIS — S32040D Wedge compression fracture of fourth lumbar vertebra, subsequent encounter for fracture with routine healing: Secondary | ICD-10-CM | POA: Diagnosis not present

## 2023-12-06 DIAGNOSIS — W19XXXD Unspecified fall, subsequent encounter: Secondary | ICD-10-CM | POA: Diagnosis not present

## 2023-12-06 DIAGNOSIS — Z8546 Personal history of malignant neoplasm of prostate: Secondary | ICD-10-CM | POA: Diagnosis not present

## 2023-12-06 DIAGNOSIS — E785 Hyperlipidemia, unspecified: Secondary | ICD-10-CM | POA: Diagnosis not present

## 2023-12-06 DIAGNOSIS — I2510 Atherosclerotic heart disease of native coronary artery without angina pectoris: Secondary | ICD-10-CM | POA: Diagnosis not present

## 2023-12-06 DIAGNOSIS — Z955 Presence of coronary angioplasty implant and graft: Secondary | ICD-10-CM | POA: Diagnosis not present

## 2023-12-08 DIAGNOSIS — Z7902 Long term (current) use of antithrombotics/antiplatelets: Secondary | ICD-10-CM | POA: Diagnosis not present

## 2023-12-08 DIAGNOSIS — I2510 Atherosclerotic heart disease of native coronary artery without angina pectoris: Secondary | ICD-10-CM | POA: Diagnosis not present

## 2023-12-08 DIAGNOSIS — G4700 Insomnia, unspecified: Secondary | ICD-10-CM | POA: Diagnosis not present

## 2023-12-08 DIAGNOSIS — E1143 Type 2 diabetes mellitus with diabetic autonomic (poly)neuropathy: Secondary | ICD-10-CM | POA: Diagnosis not present

## 2023-12-08 DIAGNOSIS — W19XXXD Unspecified fall, subsequent encounter: Secondary | ICD-10-CM | POA: Diagnosis not present

## 2023-12-08 DIAGNOSIS — E785 Hyperlipidemia, unspecified: Secondary | ICD-10-CM | POA: Diagnosis not present

## 2023-12-08 DIAGNOSIS — Z8546 Personal history of malignant neoplasm of prostate: Secondary | ICD-10-CM | POA: Diagnosis not present

## 2023-12-08 DIAGNOSIS — E669 Obesity, unspecified: Secondary | ICD-10-CM | POA: Diagnosis not present

## 2023-12-08 DIAGNOSIS — S32040D Wedge compression fracture of fourth lumbar vertebra, subsequent encounter for fracture with routine healing: Secondary | ICD-10-CM | POA: Diagnosis not present

## 2023-12-08 DIAGNOSIS — N400 Enlarged prostate without lower urinary tract symptoms: Secondary | ICD-10-CM | POA: Diagnosis not present

## 2023-12-08 DIAGNOSIS — Z6834 Body mass index (BMI) 34.0-34.9, adult: Secondary | ICD-10-CM | POA: Diagnosis not present

## 2023-12-08 DIAGNOSIS — I10 Essential (primary) hypertension: Secondary | ICD-10-CM | POA: Diagnosis not present

## 2023-12-08 DIAGNOSIS — Z955 Presence of coronary angioplasty implant and graft: Secondary | ICD-10-CM | POA: Diagnosis not present

## 2023-12-08 DIAGNOSIS — Z7984 Long term (current) use of oral hypoglycemic drugs: Secondary | ICD-10-CM | POA: Diagnosis not present

## 2023-12-14 DIAGNOSIS — N35011 Post-traumatic bulbous urethral stricture: Secondary | ICD-10-CM | POA: Diagnosis not present

## 2023-12-15 DIAGNOSIS — G4700 Insomnia, unspecified: Secondary | ICD-10-CM | POA: Diagnosis not present

## 2023-12-15 DIAGNOSIS — Z6834 Body mass index (BMI) 34.0-34.9, adult: Secondary | ICD-10-CM | POA: Diagnosis not present

## 2023-12-15 DIAGNOSIS — W19XXXD Unspecified fall, subsequent encounter: Secondary | ICD-10-CM | POA: Diagnosis not present

## 2023-12-15 DIAGNOSIS — I10 Essential (primary) hypertension: Secondary | ICD-10-CM | POA: Diagnosis not present

## 2023-12-15 DIAGNOSIS — S32040D Wedge compression fracture of fourth lumbar vertebra, subsequent encounter for fracture with routine healing: Secondary | ICD-10-CM | POA: Diagnosis not present

## 2023-12-15 DIAGNOSIS — E785 Hyperlipidemia, unspecified: Secondary | ICD-10-CM | POA: Diagnosis not present

## 2023-12-15 DIAGNOSIS — I2510 Atherosclerotic heart disease of native coronary artery without angina pectoris: Secondary | ICD-10-CM | POA: Diagnosis not present

## 2023-12-15 DIAGNOSIS — Z955 Presence of coronary angioplasty implant and graft: Secondary | ICD-10-CM | POA: Diagnosis not present

## 2023-12-15 DIAGNOSIS — E669 Obesity, unspecified: Secondary | ICD-10-CM | POA: Diagnosis not present

## 2023-12-15 DIAGNOSIS — N400 Enlarged prostate without lower urinary tract symptoms: Secondary | ICD-10-CM | POA: Diagnosis not present

## 2023-12-15 DIAGNOSIS — Z7984 Long term (current) use of oral hypoglycemic drugs: Secondary | ICD-10-CM | POA: Diagnosis not present

## 2023-12-15 DIAGNOSIS — Z8546 Personal history of malignant neoplasm of prostate: Secondary | ICD-10-CM | POA: Diagnosis not present

## 2023-12-15 DIAGNOSIS — Z7902 Long term (current) use of antithrombotics/antiplatelets: Secondary | ICD-10-CM | POA: Diagnosis not present

## 2023-12-15 DIAGNOSIS — E1143 Type 2 diabetes mellitus with diabetic autonomic (poly)neuropathy: Secondary | ICD-10-CM | POA: Diagnosis not present

## 2024-01-03 DIAGNOSIS — R918 Other nonspecific abnormal finding of lung field: Secondary | ICD-10-CM | POA: Diagnosis not present

## 2024-01-03 DIAGNOSIS — J9811 Atelectasis: Secondary | ICD-10-CM | POA: Diagnosis not present

## 2024-01-10 ENCOUNTER — Other Ambulatory Visit: Payer: Self-pay | Admitting: Podiatry

## 2024-01-16 DIAGNOSIS — H353132 Nonexudative age-related macular degeneration, bilateral, intermediate dry stage: Secondary | ICD-10-CM | POA: Diagnosis not present

## 2024-01-31 DIAGNOSIS — N35012 Post-traumatic membranous urethral stricture: Secondary | ICD-10-CM | POA: Diagnosis not present

## 2024-02-29 ENCOUNTER — Other Ambulatory Visit: Payer: Self-pay | Admitting: Podiatry

## 2024-03-27 ENCOUNTER — Other Ambulatory Visit: Payer: Self-pay | Admitting: Podiatry

## 2024-05-09 DIAGNOSIS — N35011 Post-traumatic bulbous urethral stricture: Secondary | ICD-10-CM | POA: Diagnosis not present

## 2024-05-26 ENCOUNTER — Other Ambulatory Visit: Payer: Self-pay | Admitting: Podiatry

## 2024-05-30 ENCOUNTER — Other Ambulatory Visit: Payer: Self-pay | Admitting: Podiatry

## 2024-05-30 DIAGNOSIS — K760 Fatty (change of) liver, not elsewhere classified: Secondary | ICD-10-CM | POA: Diagnosis not present

## 2024-05-30 DIAGNOSIS — J439 Emphysema, unspecified: Secondary | ICD-10-CM | POA: Diagnosis not present

## 2024-05-30 DIAGNOSIS — Z9181 History of falling: Secondary | ICD-10-CM | POA: Diagnosis not present

## 2024-05-30 DIAGNOSIS — Z1331 Encounter for screening for depression: Secondary | ICD-10-CM | POA: Diagnosis not present

## 2024-05-30 DIAGNOSIS — E7800 Pure hypercholesterolemia, unspecified: Secondary | ICD-10-CM | POA: Diagnosis not present

## 2024-05-30 DIAGNOSIS — I10 Essential (primary) hypertension: Secondary | ICD-10-CM | POA: Diagnosis not present

## 2024-05-30 DIAGNOSIS — Z79899 Other long term (current) drug therapy: Secondary | ICD-10-CM | POA: Diagnosis not present

## 2024-05-30 DIAGNOSIS — I2510 Atherosclerotic heart disease of native coronary artery without angina pectoris: Secondary | ICD-10-CM | POA: Diagnosis not present

## 2024-05-31 LAB — LAB REPORT - SCANNED: EGFR (Non-African Amer.): 59

## 2024-06-06 ENCOUNTER — Telehealth: Payer: Self-pay | Admitting: Cardiology

## 2024-06-06 NOTE — Telephone Encounter (Signed)
 Pt c/o medication issue:  1. Name of Medication: Ezetimibe 10 MG  2. How are you currently taking this medication (dosage and times per day)? N/A  3. Are you having a reaction (difficulty breathing--STAT)? No   4. What is your medication issue? Pt was told to start this medication by his PCP but would like to make sure its okay to take with his other medications.

## 2024-06-06 NOTE — Telephone Encounter (Signed)
 Patient seen PCP, Dr. Stephanie 7/29 and per patient provider wanted Dr. Jeffrie to make sure it was ok Zetia 10 mg was safe for patient to take with other cardiac medications. Made patient aware that this would be forward to Dr. Jeffrie for advice.

## 2024-06-25 NOTE — Telephone Encounter (Signed)
 Called and made patient aware safe to take Zetia per Dr. Jeffrie with other cardiac medications. Left message to call office for any questions.

## 2024-07-12 DIAGNOSIS — H353132 Nonexudative age-related macular degeneration, bilateral, intermediate dry stage: Secondary | ICD-10-CM | POA: Diagnosis not present

## 2024-09-14 DIAGNOSIS — N35012 Post-traumatic membranous urethral stricture: Secondary | ICD-10-CM | POA: Diagnosis not present

## 2024-10-05 ENCOUNTER — Other Ambulatory Visit: Payer: Self-pay | Admitting: Cardiology

## 2024-11-10 ENCOUNTER — Other Ambulatory Visit: Payer: Self-pay | Admitting: Cardiology

## 2024-12-12 ENCOUNTER — Telehealth: Payer: Self-pay | Admitting: Cardiology

## 2024-12-12 MED ORDER — CLOPIDOGREL BISULFATE 75 MG PO TABS: 75.0000 mg | ORAL_TABLET | Freq: Every day | ORAL | 1 refills | Status: AC

## 2024-12-12 NOTE — Telephone Encounter (Signed)
" °*  STAT* If patient is at the pharmacy, call can be transferred to refill team.   1. Which medications need to be refilled? (please list name of each medication and dose if known)  clopidogrel  (PLAVIX ) 75 MG tablet    2. Would you like to learn more about the convenience, safety, & potential cost savings by using the [facility]? no   3. Are you open to using the [facility] (Type [facility]. no   4. Which pharmacy/location (including street and city if local pharmacy) is medication to be sent to? [id number] - [place], [place] - [address]      5. Do they need a 30 day or 90 day supply?     Pt is completely out. Scheduled for 3/16  "

## 2024-12-12 NOTE — Telephone Encounter (Signed)
 Refill sent to last until 03/16 appt.

## 2025-01-21 ENCOUNTER — Ambulatory Visit: Admitting: Cardiology
